# Patient Record
Sex: Female | Born: 1960 | Race: White | Hispanic: No | Marital: Married | Smoking: Never smoker
Health system: Southern US, Community
[De-identification: ages and names within clinical notes are randomized; demographics above are authoritative.]

## PROBLEM LIST (undated history)

## (undated) DIAGNOSIS — E039 Hypothyroidism, unspecified: Secondary | ICD-10-CM

## (undated) DIAGNOSIS — M069 Rheumatoid arthritis, unspecified: Secondary | ICD-10-CM

## (undated) HISTORY — PX: APPENDECTOMY: SHX54

## (undated) HISTORY — DX: Rheumatoid arthritis, unspecified: M06.9

## (undated) HISTORY — PX: TOTAL ABDOMINAL HYSTERECTOMY: SHX209

## (undated) HISTORY — DX: Hypothyroidism, unspecified: E03.9

---

## 2017-10-17 DIAGNOSIS — M7660 Achilles tendinitis, unspecified leg: Secondary | ICD-10-CM | POA: Insufficient documentation

## 2017-10-17 DIAGNOSIS — M722 Plantar fascial fibromatosis: Secondary | ICD-10-CM | POA: Insufficient documentation

## 2020-10-07 LAB — HM MAMMOGRAPHY: HM Mammogram: NORMAL (ref 0–4)

## 2020-10-07 LAB — HM COLONOSCOPY

## 2021-03-16 ENCOUNTER — Ambulatory Visit: Payer: 59 | Admitting: Rheumatology

## 2021-03-24 NOTE — Progress Notes (Signed)
Office Visit Note  Patient: Kristina Chase             Date of Birth: 1961-06-01           MRN: [id number]             PCP: Pcp, No Referring: No ref. provider found Visit Date: 04/07/2021 Occupation: _0 @  Subjective:  Medication management.   History of Present Illness: Kristina Chase is a 60 y.o. female seen in consultation per request of [place] [facility].  According the patient her symptoms a started with the bilateral knee joint pain about 10 years ago.  She states she was diagnosed with osteoarthritis by an orthopedic surgeon and went for physical therapy.  She also recalls having cortisone injection without much relief.  She continues to have pain and discomfort in her knee joints and has difficulty climbing stairs.  She states long distance walking is also limited due to knee joint discomfort.  In 2016 she started having problems with Planter fasciitis and Achilles tendinitis.  She states she had about 2 episodes of plantar fasciitis and one episode of Achilles tendinitis.  She was having a lot of difficulty walking.  She was also diagnosed with osteoarthritis in her feet by a podiatrist.  In the meantime she moved to [place] where she was evaluated by a rheumatologist who diagnosed her with seronegative rheumatoid arthritis.  She was initially tried on sulfasalazine and then Humira was added.  She had very good response to Humira and sulfasalazine was discontinued.  She states she has done very well on Humira without any joint swelling.  She continues to have some discomfort in her knee joints and her feet.  She denies any history of lower back pain or SI joint discomfort.  There is no history of iritis or uveitis.  There is no history of psoriasis.  There is no family history of psoriasis.  There is history of Crohn's disease in her maternal aunt.  She is gravida 0, there is no history of DVTs.  Activities of Daily Living:  Patient reports morning stiffness for 72mnute.   Patient  Denies nocturnal pain.  Difficulty dressing/grooming: Denies Difficulty climbing stairs: Reports Difficulty getting out of chair: Denies Difficulty using hands for taps, buttons, cutlery, and/or writing: Denies  Review of Systems  Constitutional:  Negative for fatigue, night sweats, weight gain and weight loss.  HENT:  Positive for mouth dryness. Negative for mouth sores, trouble swallowing, trouble swallowing and nose dryness.        Related to zyrtec and Lasik surgery.  Eyes:  Positive for dryness. Negative for pain, redness and visual disturbance.  Respiratory:  Negative for cough, shortness of breath and difficulty breathing.   Cardiovascular:  Negative for chest pain, palpitations, hypertension, irregular heartbeat and swelling in legs/feet.  Gastrointestinal:  Negative for blood in stool, constipation and diarrhea.  Endocrine: Negative for increased urination.  Genitourinary:  Negative for vaginal dryness.  Musculoskeletal:  Positive for joint pain and joint pain. Negative for joint swelling, myalgias, muscle weakness, morning stiffness, muscle tenderness and myalgias.  Skin:  Negative for color change, rash, hair loss, skin tightness, ulcers and sensitivity to sunlight.  Allergic/Immunologic: Negative for susceptible to infections.  Neurological:  Negative for dizziness, memory loss, night sweats and weakness.  Hematological:  Negative for swollen glands.  Psychiatric/Behavioral:  Positive for sleep disturbance. Negative for depressed mood. The patient is not nervous/anxious.    PMFS History:  Patient Active Problem List   Diagnosis Date  Noted   Autoimmune thyroiditis 04/07/2021   Primary osteoarthritis of both knees 04/07/2021    Past Medical History:  Diagnosis Date   Hypothyroid    Rheumatoid arthritis (HCC)     Family History  Problem Relation Age of Onset   Cancer Mother        Breast   Atrial fibrillation Mother    Hypertension Mother    Hypothyroidism Mother     Past Surgical History:  Procedure Laterality Date   APPENDECTOMY     TOTAL ABDOMINAL HYSTERECTOMY     Social History   Social History Narrative   Not on file   Immunization History  Administered Date(s) Administered   PFIZER(Purple Top)SARS-COV-2 Vaccination 11/09/2019, 11/30/2019, 06/15/2020, 12/07/2020     Objective: Vital Signs: BP 130/80 (BP Location: Right Arm, Patient Position: Sitting, Cuff Size: Small)   Pulse 70   Resp 12   Ht 5' 7.5" (1.715 m)   Wt 202 lb 12.8 oz (92 kg)   BMI 31.29 kg/m    Physical Exam Vitals and nursing note reviewed.  Constitutional:      Appearance: She is well-developed.  HENT:     Head: Normocephalic and atraumatic.  Eyes:     Conjunctiva/sclera: Conjunctivae normal.  Cardiovascular:     Rate and Rhythm: Normal rate and regular rhythm.     Heart sounds: Normal heart sounds.  Pulmonary:     Effort: Pulmonary effort is normal.     Breath sounds: Normal breath sounds.  Abdominal:     General: Bowel sounds are normal.     Palpations: Abdomen is soft.  Musculoskeletal:     Cervical back: Normal range of motion.  Lymphadenopathy:     Cervical: No cervical adenopathy.  Skin:    General: Skin is warm and dry.     Capillary Refill: Capillary refill takes less than 2 seconds.  Neurological:     Mental Status: She is alert and oriented to person, place, and time.  Psychiatric:        Behavior: Behavior normal.     Musculoskeletal Exam: C-spine thoracic and lumbar spine were in good range of motion.  She had no SI joint tenderness.  Shoulder joints, elbow joints, wrist joints, MCPs PIPs and DIPs with good range of motion with no synovitis.  Hip joints, knee joints, ankles, MTPs and PIPs with good range of motion with no synovitis.  She had bilateral pes cavus and hammertoes.  CDAI Exam: CDAI Score: 0.2  Patient Global: 1 mm; Provider Global: 1 mm Swollen: 0 ; Tender: 0  Joint Exam 04/07/2021   No joint exam has been documented  for this visit   There is currently no information documented on the homunculus. Go to the Rheumatology activity and complete the homunculus joint exam.  Investigation: No additional findings.  Imaging: XR Foot 2 Views Left  Result Date: 04/07/2021 PIP and DIP narrowing was noted.  No MTP, intertarsal, tibiotalar or subtalar joint space narrowing was noted.  Inferior calcaneal spur was noted.  No erosive changes were noted. Impression: These findings are consistent with osteoarthritis of the foot.  XR Foot 2 Views Right  Result Date: 04/07/2021 No MTP narrowing was noted.  PIP and DIP narrowing was noted.  Callus formation was noted in the fourth metatarsal shaft.  No intertarsal, tibiotalar or subtalar joint space narrowing was noted.  No erosive changes were noted.  Inferior calcaneal spur was noted. Impression: These findings are consistent with osteoarthritis of the foot.  XR   Hand 2 View Left  Result Date: 04/07/2021 Mild CMC, PIP and DIP narrowing was noted.  No MCP, intercarpal or radiocarpal joint space narrowing was noted.  No erosive changes were noted. Impression: These findings are consistent with osteoarthritis of the hand.  XR Hand 2 View Right  Result Date: 04/07/2021 Mild CMC, PIP and DIP narrowing was noted.  No MCP, intercarpal or radiocarpal joint space narrowing was noted.  No erosive changes were noted. Impression: These findings are consistent with osteoarthritis of the hand.  XR KNEE 3 VIEW LEFT  Result Date: 04/07/2021 Mild medial compartment narrowing and mild patellofemoral narrowing was noted.  No chondrocalcinosis was noted. Impression: These findings are consistent with mild osteoarthritis and mild chondromalacia patella.  XR KNEE 3 VIEW RIGHT  Result Date: 04/07/2021 Mild medial compartment narrowing and mild patellofemoral narrowing was noted.  No chondrocalcinosis was noted. Impression: These findings are consistent with mild osteoarthritis and mild  chondromalacia patella.   Recent Labs: No results found for: WBC, HGB, PLT, NA, K, CL, CO2, GLUCOSE, BUN, CREATININE, BILITOT, ALKPHOS, AST, ALT, PROT, ALBUMIN, CALCIUM, GFRAA, QFTBGOLD, QFTBGOLDPLUS  Speciality Comments: No specialty comments available.  Procedures:  No procedures performed Allergies: Grass extracts [gramineae pollens]   March 22 2015 HLA-B27 negative  Assessment / Plan:     Visit Diagnoses: Seronegative rheumatoid arthritis (HCC) - Dx at [facility]-2019.  F/u at [facility].  Patient had no synovitis on examination.  She was diagnosed with seronegative rheumatoid arthritis in the past.  She also had symptoms of plantar fasciitis and Achilles tendinitis.  A spondyloarthropathy was also in the differential at the time.  She was initially treated with sulfasalazine and then was switched to Humira.  She has done well on Humira.  She had no synovitis on examination today.  Medication counseling:   TB Test: Pending Hepatitis panel: Pending HIV: Pending SPEP: Pending Immunoglobulins: Pending  Chest x-ray: Pending  Does patient have diagnosis of heart failure?  No  Counseled patient that Humira is a TNF blocking agent.  Reviewed Humira dose of 40 mg every other week.  Counseled patient on purpose, proper use, and adverse effects of Humira.  Reviewed the most common adverse effects including infections, headache, and injection site reactions. Discussed that there is the possibility of an increased risk of malignancy but it is not well understood if this increased risk is due to the medication or the disease state.  Advised patient to get yearly dermatology exams due to risk of skin cancer.  Reviewed the importance of regular labs while on Humira therapy.  Advised patient to get standing labs one month after starting Humira then every 3 months.  Provided patient with standing lab orders.  Counseled patient that Humira should be held prior to scheduled surgery.  Counseled  patient to avoid live vaccines while on Humira.  Advised patient to get annual influenza vaccine and the pneumococcal vaccine as needed.  Provided patient with medication education material and answered all questions.  Patient voiced understanding.  Patient consented to Humira.  Will upload consent into the media tab.  Reviewed storage instructions of Humira.  She was advised to stop Humira in case she develops an infection and resume once the infection resolves.     High risk medication use - Initially started on SSZ then combo with Humira.  Now-Humira as monotherapy due to clinical improvement adalimumab level WNL 2021.  -I will obtain baseline labs today.  Her baseline chest x-ray will be obtained.  Plan:   CBC with Differential/Platelet, COMPLETE METABOLIC PANEL WITH GFR, Hepatitis B core antibody, IgM, Hepatitis B surface antigen, Hepatitis C antibody, QuantiFERON-TB Gold Plus, Serum protein electrophoresis with reflex, IgG, IgA, IgM, HIV Antibody (routine testing w rflx), DG Chest 2 View  Pain in both hands -she complains of discomfort in her bilateral hands.  No synovitis was noted.  Plan: Sedimentation rate, Rheumatoid factor, Cyclic citrul peptide antibody, IgG, ANA, XR Hand 2 View Right, XR Hand 2 View Left.  X-rays of bilateral hands were consistent with osteoarthritis.  Chronic pain of both knees -she complains of ongoing pain and discomfort in her bilateral knee joints.  No warmth swelling or effusion was noted.  Plan: XR KNEE 3 VIEW RIGHT, XR KNEE 3 VIEW LEFT.  X-ray showed mild osteoarthritis and mild chondromalacia patella.  Primary osteoarthritis of both knees-she was diagnosed with osteoarthritis in the past.  Pain in both feet -she complains of discomfort in the bilateral feet.  Plan: XR Foot 2 Views Right, XR Foot 2 Views Left, x-rays show are consistent with osteoarthritis.  Callus formation was noted in the right fourth metatarsal shaft.  Achilles tendinitis of both lower extremities  - HLA-B27 negative  Plantar fasciitis, bilateral-patient had 2 episodes of plantar fasciitis and one episode of Achilles tendinitis in the past.  She states her symptoms resolved after starting Humira.  Other fatigue - Plan: CK, TSH  Autoimmune thyroiditis  Family history of Crohn's disease - maternal aunt  Orders: Orders Placed This Encounter  Procedures   XR Hand 2 View Right   XR Hand 2 View Left   XR Foot 2 Views Right   XR Foot 2 Views Left   XR KNEE 3 VIEW RIGHT   XR KNEE 3 VIEW LEFT   DG Chest 2 View   CBC with Differential/Platelet   COMPLETE METABOLIC PANEL WITH GFR   Sedimentation rate   CK   TSH   Rheumatoid factor   Cyclic citrul peptide antibody, IgG   ANA   Hepatitis B core antibody, IgM   Hepatitis B surface antigen   Hepatitis C antibody   QuantiFERON-TB Gold Plus   Serum protein electrophoresis with reflex   IgG, IgA, IgM   HIV Antibody (routine testing w rflx)    No orders of the defined types were placed in this encounter.    Follow-Up Instructions: Return for Rheumatoid arthritis.   Bo Merino, MD  Note - This record has been created using Editor, commissioning.  Chart creation errors have been sought, but may not always  have been located. Such creation errors do not reflect on  the standard of medical care.

## 2021-04-07 ENCOUNTER — Ambulatory Visit: Payer: Self-pay

## 2021-04-07 ENCOUNTER — Ambulatory Visit (INDEPENDENT_AMBULATORY_CARE_PROVIDER_SITE_OTHER): Payer: 59 | Admitting: Rheumatology

## 2021-04-07 ENCOUNTER — Other Ambulatory Visit: Payer: Self-pay

## 2021-04-07 ENCOUNTER — Encounter: Payer: Self-pay | Admitting: Rheumatology

## 2021-04-07 VITALS — BP 130/80 | HR 70 | Resp 12 | Ht 67.5 in | Wt 202.8 lb

## 2021-04-07 DIAGNOSIS — M79671 Pain in right foot: Secondary | ICD-10-CM | POA: Diagnosis not present

## 2021-04-07 DIAGNOSIS — M7661 Achilles tendinitis, right leg: Secondary | ICD-10-CM

## 2021-04-07 DIAGNOSIS — M19071 Primary osteoarthritis, right ankle and foot: Secondary | ICD-10-CM

## 2021-04-07 DIAGNOSIS — E063 Autoimmune thyroiditis: Secondary | ICD-10-CM

## 2021-04-07 DIAGNOSIS — M79642 Pain in left hand: Secondary | ICD-10-CM

## 2021-04-07 DIAGNOSIS — M170 Bilateral primary osteoarthritis of knee: Secondary | ICD-10-CM

## 2021-04-07 DIAGNOSIS — G8929 Other chronic pain: Secondary | ICD-10-CM

## 2021-04-07 DIAGNOSIS — M0600 Rheumatoid arthritis without rheumatoid factor, unspecified site: Secondary | ICD-10-CM

## 2021-04-07 DIAGNOSIS — M19072 Primary osteoarthritis, left ankle and foot: Secondary | ICD-10-CM

## 2021-04-07 DIAGNOSIS — Z79899 Other long term (current) drug therapy: Secondary | ICD-10-CM | POA: Diagnosis not present

## 2021-04-07 DIAGNOSIS — M722 Plantar fascial fibromatosis: Secondary | ICD-10-CM

## 2021-04-07 DIAGNOSIS — M79672 Pain in left foot: Secondary | ICD-10-CM | POA: Diagnosis not present

## 2021-04-07 DIAGNOSIS — M7662 Achilles tendinitis, left leg: Secondary | ICD-10-CM

## 2021-04-07 DIAGNOSIS — M25561 Pain in right knee: Secondary | ICD-10-CM

## 2021-04-07 DIAGNOSIS — Z8379 Family history of other diseases of the digestive system: Secondary | ICD-10-CM

## 2021-04-07 DIAGNOSIS — M25562 Pain in left knee: Secondary | ICD-10-CM

## 2021-04-07 DIAGNOSIS — R5383 Other fatigue: Secondary | ICD-10-CM

## 2021-04-07 DIAGNOSIS — M79641 Pain in right hand: Secondary | ICD-10-CM | POA: Diagnosis not present

## 2021-04-07 NOTE — Patient Instructions (Signed)
Adalimumab Injection What is this medication? ADALIMUMAB (ay da LIM yoo mab) is used to treat rheumatoid and psoriatic arthritis. It is also used to treat ankylosing spondylitis, Crohn's disease, ulcerative colitis, plaque psoriasis, hidradenitis suppurativa, and uveitis. This medicine may be used for other purposes; ask your health care provider or pharmacist if you have questions. COMMON BRAND NAME(S): CYLTEZO, Humira What should I tell my care team before I take this medication? They need to know if you have any of these conditions: cancer diabetes (high blood sugar) having surgery heart disease hepatitis B immune system problems infections, such as tuberculosis (TB) or other bacterial, fungal, or viral infections multiple sclerosis recent or upcoming vaccine an unusual reaction to adalimumab, mannitol, latex, rubber, other medicines, foods, dyes, or preservatives pregnant or trying to get pregnant breast-feeding How should I use this medication? This medicine is for injection under the skin. You will be taught how to prepare and give it. Take it as directed on the prescription label. Keep taking it unless your health care provider tells you to stop. It is important that you put your used needles and syringes in a special sharps container. Do not put them in a trash can. If you do not have a sharps container, call your pharmacist or health care provider to get one. This medicine comes with INSTRUCTIONS FOR USE. Ask your pharmacist for directions on how to use this medicine. Read the information carefully. Talk to your pharmacist or health care provider if you have questions. A special MedGuide will be given to you by the pharmacist with each prescription and refill. Be sure to read this information carefully each time. Talk to your pediatrician regarding the use of this medicine in children. While this drug may be prescribed for children as young as 2 years for selected conditions,  precautions do apply. Overdosage: If you think you have taken too much of this medicine contact a poison control center or emergency room at once. NOTE: This medicine is only for you. Do not share this medicine with others. What if I miss a dose? If you miss a dose, take it as soon as you can. If it is almost time for your next dose, take only that dose. Do not take double or extra doses. It is important not to miss any doses. Talk to your health care provider about what to do if you miss a dose. What may interact with this medication? Do not take this medicine with any of the following medications: abatacept anakinra biologic medicines such as certolizumab, etanercept, golimumab, infliximab live virus vaccines This medicine may also interact with the following medications: cyclosporine theophylline vaccines warfarin This list may not describe all possible interactions. Give your health care provider a list of all the medicines, herbs, non-prescription drugs, or dietary supplements you use. Also tell them if you smoke, drink alcohol, or use illegal drugs. Some items may interact with your medicine. What should I watch for while using this medication? Visit your health care provider for regular checks on your progress. Tell your health care provider if your symptoms do not start to get better or if they get worse. You will be tested for tuberculosis (TB) before you start this medicine. If your doctor prescribes any medicine for TB, you should start taking the TB medicine before starting this medicine. Make sure to finish the full course of TB medicine. This medicine may increase your risk of getting an infection. Call your health care provider for advice if you  get a fever, chills, sore throat, or other symptoms of a cold or flu. Do not treat yourself. Try to avoid being around people who are sick. Talk to your health care provider about your risk of cancer. You may be more at risk for certain  types of cancer if you take this medicine. What side effects may I notice from receiving this medication? Side effects that you should report to your doctor or health care professional as soon as possible: allergic reactions like skin rash, itching or hives, swelling of the face, lips, or tongue changes in vision chest pain dizziness heart failure (trouble breathing; fast, irregular heartbeat; sudden weight gain; swelling of the ankles, feet, hands; unusually weak or tired) infection (fever, chills, cough, sore throat, pain or trouble passing urine) liver injury (dark yellow or brown urine; general ill feeling or flu-like symptoms; loss of appetite, right upper belly pain; unusually weak or tired, yellowing of the eyes or skin) lump or swollen lymph nodes on the neck, groin, or underarm area muscle weakness pain, tingling, numbness in the hands or feet red, scaly patches or raised bumps on the skin trouble breathing unusual bleeding or bruising unusually weak or tired Side effects that usually do not require medical attention (report to your doctor or health care professional if they continue or are bothersome): headache nausea pain, redness, or irritation at site where injected stuffy or runny nose This list may not describe all possible side effects. Call your doctor for medical advice about side effects. You may report side effects to FDA at 1-800-FDA-1088. Where should I keep my medication? Keep out of the reach of children and pets. Store in the refrigerator between 2 and 8 degrees C (36 and 46 degrees F). Do not freeze. Keep this medicine in the original packaging until you are ready to take it. Protect from light. Get rid of any unused medicine after the expiration date. This medicine may be stored at room temperature for up to 14 days. Keep this medicine in the original packaging. Protect from light. If it is stored at room temperature, get rid of any unused medicine after 14 days  or after it expires, whichever is first. To get rid of medicines that are no longer needed or have expired: Take the medicine to a medicine take-back program. Check with your pharmacy or law enforcement to find a location. If you cannot return the medicine, ask your pharmacist or health care provider how to get rid of this medicine safely. NOTE: This sheet is a summary. It may not cover all possible information. If you have questions about this medicine, talk to your doctor, pharmacist, or health care provider.  2022 Elsevier/Gold Standard (2019-09-03 1 If you test POSITIVE for COVID19 and have MILD to MODERATE symptoms: First, call your PCP if you would like to receive COVID19 treatment AND Hold your medications during the infection and for at least 1 week after your symptoms have resolved: Injectable medication (Benlysta, Cimzia, Cosentyx, Enbrel, Humira, Orencia, Remicade, Simponi, Stelara, Taltz, Tremfya) Methotrexate Leflunomide (Arava) Azathioprine Mycophenolate (Cellcept) Osborne O[place], or Rinvoq Otezla If you take Actemra or Kevzara, you DO NOT need to hold these for COVID19 infection.  If you test POSITIVE for COVID19 and have NO symptoms: First, call your PCP if you would like to receive COVID19 treatment AND Hold your medications for at least 10 days after the day that you tested positive Injectable medication (Benlysta, Cimzia, Cosentyx, Enbrel, Humira, Orencia, Remicade, Simponi, Stelara, Taltz, Tremfya) Methotrexate Leflunomide (  Arava) Azathioprine Mycophenolate (Cellcept) Osborne [place], or Rinvoq [place] If you take Actemra or Carlis Abbott, you DO NOT need to hold these for COVID19 infection.  If you have signs or symptoms of an infection or start antibiotics: First, call your PCP for workup of your infection. Hold your medication through the infection, until you complete your antibiotics, and until symptoms resolve if you take the following: Injectable medication  (Actemra, Benlysta, Cimzia, Cosentyx, Enbrel, Humira, Kevzara, Orencia, Remicade, Simponi, Stelara, Taltz, Tremfya) Methotrexate Leflunomide (Arava) Mycophenolate (Cellcept) Harriette Ohara, Olumiant, or Rinvoq  Vaccines You are taking a medication(s) that can suppress your immune system.  The following immunizations are recommended: Flu annually Covid-19  Td/Tdap (tetanus, diphtheria, pertussis) every 10 years Pneumonia (Prevnar 15 then Pneumovax 23 at least 1 year apart.  Alternatively, can take Prevnar 20 without needing additional dose) Shingrix: 2 doses from 4 weeks to 6 months apart  Please check with your PCP to make sure you are up to date.  Please a schedule an appointment with your dermatologist to screen for nonmelanoma skin cancer while you are Humira.   Standing Labs We placed an order today for your standing lab work.   Please have your standing labs drawn in December and every 3 months  If possible, please have your labs drawn 2 weeks prior to your appointment so that the provider can discuss your results at your appointment.  Please note that you may see your imaging and lab results in MyChart before we have reviewed them. We may be awaiting multiple results to interpret others before contacting you. Please allow our office up to 72 hours to thoroughly review all of the results before contacting the office for clarification of your results.  We have open lab daily: Monday through Thursday from 1:30-4:30 PM and Friday from 1:30-4:00 PM at the office of Dr. Pollyann Savoy, [facility].   Please be advised, all patients with office appointments requiring lab work will take precedent over walk-in lab work.  If possible, please come for your lab work on Monday and Friday afternoons, as you may experience shorter wait times. The office is located at [address], [place], [place] [address] No appointment is necessary.   Labs are drawn by [facility]. Please  bring your co-pay at the time of your lab draw.  You may receive a bill from [facility] for your lab work.  If you wish to have your labs drawn at another location, please call the office 24 hours in advance to send orders.  If you have any questions regarding directions or hours of operation,  please call [phone].   As a reminder, please drink plenty of water prior to coming for your lab work. Thanks!

## 2021-04-10 ENCOUNTER — Ambulatory Visit: Payer: 59 | Admitting: Rheumatology

## 2021-04-10 ENCOUNTER — Encounter: Payer: Self-pay | Admitting: Rheumatology

## 2021-04-10 NOTE — Progress Notes (Signed)
Please add anti-tTG and antigliadin antibody if possible

## 2021-04-11 ENCOUNTER — Telehealth: Payer: Self-pay | Admitting: Pharmacist

## 2021-04-11 DIAGNOSIS — Z79899 Other long term (current) drug therapy: Secondary | ICD-10-CM

## 2021-04-11 DIAGNOSIS — M0600 Rheumatoid arthritis without rheumatoid factor, unspecified site: Secondary | ICD-10-CM

## 2021-04-11 NOTE — Telephone Encounter (Signed)
Submitted a Prior Authorization request to [facility] for HUMIRA via CoverMyMeds. Will update once we receive a response.   Key: [id number] - PA Case ID: [id number]  Humira copay card:  Card: [id number]  Rx GROUP: [id number]  Rx BIN: [id number]  Rx PCN: [id number]  Suf: 01

## 2021-04-11 NOTE — Telephone Encounter (Signed)
Please start Humira BIV. This is continuation of therapy (patient transferring care from [place])  Dose: 40mg  every 14 days  Dx: Rheumatoid arthritis (M05.9)  Previously tried therapies: Sulfasalazine  Patient eligible for copay card  , PharmD, MPH, BCPS Clinical Pharmacist (Rheumatology and Pulmonology)

## 2021-04-12 LAB — GLIADIN ANTIBODIES, SERUM
Gliadin IgA: 1.7 U/mL
Gliadin IgG: <1 U/mL

## 2021-04-12 LAB — TISSUE TRANSGLUTAMINASE ABS,IGG,IGA
(tTG) Ab, IgA: <1 U/mL
(tTG) Ab, IgG: 1 U/mL

## 2021-04-12 LAB — CBC WITH DIFFERENTIAL/PLATELET
Absolute Monocytes: 725 cells/uL (ref 200–950)
Basophils Absolute: 69 cells/uL (ref 0–200)
Basophils Relative: 1 %
Eosinophils Absolute: 228 cells/uL (ref 15–500)
Eosinophils Relative: 3.3 %
HCT: 43.1 % (ref 35.0–45.0)
Hemoglobin: 14.2 g/dL (ref 11.7–15.5)
Lymphs Abs: 2774 cells/uL (ref 850–3900)
MCH: 30 pg (ref 27.0–33.0)
MCHC: 32.9 g/dL (ref 32.0–36.0)
MCV: 90.9 fL (ref 80.0–100.0)
MPV: 11.1 fL (ref 7.5–12.5)
Monocytes Relative: 10.5 %
Neutro Abs: 3105 cells/uL (ref 1500–7800)
Neutrophils Relative %: 45 %
Platelets: 265 10*3/uL (ref 140–400)
RBC: 4.74 10*6/uL (ref 3.80–5.10)
RDW: 12.4 % (ref 11.0–15.0)
Total Lymphocyte: 40.2 %
WBC: 6.9 10*3/uL (ref 3.8–10.8)

## 2021-04-12 LAB — QUANTIFERON-TB GOLD PLUS
Mitogen-NIL: >10 IU/mL
NIL: 0.08 IU/mL
QuantiFERON-TB Gold Plus: NEGATIVE
TB1-NIL: 0 IU/mL
TB2-NIL: 0 IU/mL

## 2021-04-12 LAB — PROTEIN ELECTROPHORESIS, SERUM, WITH REFLEX
Albumin ELP: 4.1 g/dL (ref 3.8–4.8)
Alpha 1: 0.2 g/dL (ref 0.2–0.3)
Alpha 2: 0.7 g/dL (ref 0.5–0.9)
Beta 2: 0.5 g/dL (ref 0.2–0.5)
Beta Globulin: 0.5 g/dL (ref 0.4–0.6)
Gamma Globulin: 1.1 g/dL (ref 0.8–1.7)
Total Protein: 7 g/dL (ref 6.1–8.1)

## 2021-04-12 LAB — COMPLETE METABOLIC PANEL WITH GFR
AG Ratio: 1.4 (calc) (ref 1.0–2.5)
ALT: 19 U/L (ref 6–29)
AST: 20 U/L (ref 10–35)
Albumin: 4 g/dL (ref 3.6–5.1)
Alkaline phosphatase (APISO): 65 U/L (ref 37–153)
BUN: 19 mg/dL (ref 7–25)
CO2: 26 mmol/L (ref 20–32)
Calcium: 9.6 mg/dL (ref 8.6–10.4)
Chloride: 99 mmol/L (ref 98–110)
Creat: 0.79 mg/dL (ref 0.50–1.03)
Globulin: 2.8 g/dL (calc) (ref 1.9–3.7)
Glucose, Bld: 83 mg/dL (ref 65–99)
Potassium: 4.2 mmol/L (ref 3.5–5.3)
Sodium: 138 mmol/L (ref 135–146)
Total Bilirubin: 0.4 mg/dL (ref 0.2–1.2)
Total Protein: 6.8 g/dL (ref 6.1–8.1)
eGFR: 87 mL/min/{1.73_m2} (ref >=60)

## 2021-04-12 LAB — HEPATITIS B CORE ANTIBODY, IGM: Hep B C IgM: NONREACTIVE

## 2021-04-12 LAB — TEST AUTHORIZATION

## 2021-04-12 LAB — HEPATITIS C ANTIBODY
Hepatitis C Ab: NONREACTIVE
SIGNAL TO CUT-OFF: 0.01 (ref <1.00)

## 2021-04-12 LAB — SEDIMENTATION RATE: Sed Rate: 2 mm/h (ref 0–30)

## 2021-04-12 LAB — HEPATITIS B SURFACE ANTIGEN: Hepatitis B Surface Ag: NONREACTIVE

## 2021-04-12 LAB — HIV ANTIBODY (ROUTINE TESTING W REFLEX): HIV 1&2 Ab, 4th Generation: NONREACTIVE

## 2021-04-12 LAB — CYCLIC CITRUL PEPTIDE ANTIBODY, IGG: Cyclic Citrullin Peptide Ab: <16 UNITS

## 2021-04-12 LAB — CK: Total CK: 49 U/L (ref 29–143)

## 2021-04-12 LAB — ANA: Anti Nuclear Antibody (ANA): NEGATIVE

## 2021-04-12 LAB — TSH: TSH: 1.27 mIU/L (ref 0.40–4.50)

## 2021-04-12 LAB — IGG, IGA, IGM
IgG (Immunoglobin G), Serum: 1149 mg/dL (ref 600–1640)
IgM, Serum: 112 mg/dL (ref 50–300)
Immunoglobulin A: 425 mg/dL — ABNORMAL HIGH (ref 47–310)

## 2021-04-12 LAB — RHEUMATOID FACTOR: Rheumatoid fact SerPl-aCnc: <14 IU/mL (ref <14)

## 2021-04-12 NOTE — Telephone Encounter (Signed)
Received notification from [facility] regarding a prior authorization for HUMIRA. Authorization has been previously APPROVED from 09/07/20 to 09/07/21.   Patient must continue to fill through [facility]: [phone]   Authorization # [id number] Phone # [phone]  Awaiting chest x-ray results prior to sending refills to pharmacy  Chesley Mires, PharmD, MPH, BCPS Clinical Pharmacist (Rheumatology and Pulmonology)

## 2021-04-12 NOTE — Progress Notes (Signed)
All the labs are within normal limits.  I will discuss results at the follow-up visit.

## 2021-04-17 NOTE — Progress Notes (Signed)
Office Visit Note  Patient: Kristina Chase             Date of Birth: April 11, 1961           MRN: [id number]             PCP: Jeanie Sewer, NP Referring: No ref. provider found Visit Date: 05/01/2021 Occupation: _0 @  Subjective:  Joint pain.   History of Present Illness: Kristina Chase is a 60 y.o. female with a history of seronegative rheumatoid arthritis.  She continues to have some pain and stiffness in her hands and feet.  She has not seen any visible swelling.  She has not had an episode of Achilles tendinitis or plantar fasciitis in the last 1 year.  Activities of Daily Living:  Patient reports morning stiffness for 0 minutes.   Patient Reports nocturnal pain.  Difficulty dressing/grooming: Denies Difficulty climbing stairs: Reports Difficulty getting out of chair: Denies Difficulty using hands for taps, buttons, cutlery, and/or writing: Reports  Review of Systems  Constitutional:  Negative for fatigue.  HENT:  Negative for mouth sores, mouth dryness and nose dryness.   Eyes:  Positive for dryness. Negative for pain and itching.  Respiratory:  Negative for shortness of breath and difficulty breathing.   Cardiovascular:  Negative for chest pain and palpitations.  Gastrointestinal:  Negative for blood in stool, constipation and diarrhea.  Endocrine: Negative for increased urination.  Genitourinary:  Negative for difficulty urinating.  Musculoskeletal:  Positive for joint pain, joint pain, myalgias and myalgias. Negative for joint swelling, morning stiffness and muscle tenderness.  Skin:  Negative for color change, rash and redness.  Allergic/Immunologic: Negative for susceptible to infections.  Neurological:  Negative for dizziness, numbness, headaches, memory loss and weakness.  Hematological:  Positive for bruising/bleeding tendency.  Psychiatric/Behavioral:  Positive for sleep disturbance. Negative for confusion.    PMFS History:  Patient Active Problem List    Diagnosis Date Noted   Primary osteoarthritis of both hands 05/01/2021   Primary osteoarthritis of both feet 05/01/2021   LBBB (left bundle branch block) 05/01/2021   Hilar lymphadenopathy 05/01/2021   Hormone replacement therapy (postmenopausal) 04/21/2021   Adjustment insomnia 04/21/2021   Seronegative rheumatoid arthritis ([place]) 04/21/2021   Need for vaccination 04/21/2021   Autoimmune thyroiditis 04/07/2021   Primary osteoarthritis of both knees 04/07/2021    Past Medical History:  Diagnosis Date   Hypothyroid    Rheumatoid arthritis ([place])     Family History  Problem Relation Age of Onset   Cancer Mother        Breast   Atrial fibrillation Mother    Hypertension Mother    Hypothyroidism Mother    Past Surgical History:  Procedure Laterality Date   APPENDECTOMY     TOTAL ABDOMINAL HYSTERECTOMY     Social History   Social History Narrative   Not on file   Immunization History  Administered Date(s) Administered   Influenza,inj,Quad PF,6+ Mos 04/21/2021   PFIZER(Purple Top)SARS-COV-2 Vaccination 11/09/2019, 11/30/2019, 06/15/2020, 12/07/2020, 04/24/2021   Zoster Recombinat (Shingrix) 07/09/2017     Objective: Vital Signs: BP 120/82 (BP Location: Left Arm, Patient Position: Sitting, Cuff Size: Normal)   Pulse 73   Ht _1  (1.727 m)   Wt 202 lb 9.6 oz (91.9 kg)   BMI 30.81 kg/m    Physical Exam Vitals and nursing note reviewed.  Constitutional:      Appearance: She is well-developed.  HENT:     Head: Normocephalic and atraumatic.  Eyes:  Conjunctiva/sclera: Conjunctivae normal.  Cardiovascular:     Rate and Rhythm: Normal rate and regular rhythm.     Heart sounds: Normal heart sounds.  Pulmonary:     Effort: Pulmonary effort is normal.     Breath sounds: Normal breath sounds.  Abdominal:     General: Bowel sounds are normal.     Palpations: Abdomen is soft.  Musculoskeletal:     Cervical back: Normal range of motion.  Lymphadenopathy:      Cervical: No cervical adenopathy.  Skin:    General: Skin is warm and dry.     Capillary Refill: Capillary refill takes less than 2 seconds.  Neurological:     Mental Status: She is alert and oriented to person, place, and time.  Psychiatric:        Behavior: Behavior normal.     Musculoskeletal Exam: C-spine was in good range of motion.  Shoulder joints and elbow joints with good range of motion.  She had discomfort over SI joints.  Shoulder joints, elbow joints, wrist joints, MCPs PIPs and DIPs with good range of motion.  She had tenderness on palpation of her right second third fourth and fifth MCPs.  Hip joints and knee joints in good range of motion.  There was no tenderness over ankles or MTPs.  CDAI Exam: CDAI Score: 4.4  Patient Global: 2 mm; Provider Global: 2 mm Swollen: 0 ; Tender: 4  Joint Exam 05/01/2021      Right  Left  MCP 2   Tender     MCP 3   Tender     MCP 4   Tender     MCP 5   Tender        Investigation: No additional findings.  Imaging: DG Chest 2 View  Result Date: 04/20/2021 CLINICAL DATA:  Immunosuppressive therapy EXAM: CHEST - 2 VIEW COMPARISON:  None. FINDINGS: The heart size and mediastinal contours are within normal limits. Multiple calcified mediastinal and left hilar lymph nodes suggesting sequela of chronic granulomatous disease. Both lungs are clear. The visualized skeletal structures are unremarkable. IMPRESSION: 1. No active cardiopulmonary disease. 2. Calcified mediastinal and left hilar lymph nodes suggesting sequela of granulomatous disease. Electronically Signed   By: Davina Poke D.O.   On: 04/20/2021 15:50   XR Foot 2 Views Left  Result Date: 04/07/2021 PIP and DIP narrowing was noted.  No MTP, intertarsal, tibiotalar or subtalar joint space narrowing was noted.  Inferior calcaneal spur was noted.  No erosive changes were noted. Impression: These findings are consistent with osteoarthritis of the foot.  XR Foot 2 Views  Right  Result Date: 04/07/2021 No MTP narrowing was noted.  PIP and DIP narrowing was noted.  Callus formation was noted in the fourth metatarsal shaft.  No intertarsal, tibiotalar or subtalar joint space narrowing was noted.  No erosive changes were noted.  Inferior calcaneal spur was noted. Impression: These findings are consistent with osteoarthritis of the foot.  XR Hand 2 View Left  Result Date: 04/07/2021 Mild CMC, PIP and DIP narrowing was noted.  No MCP, intercarpal or radiocarpal joint space narrowing was noted.  No erosive changes were noted. Impression: These findings are consistent with osteoarthritis of the hand.  XR Hand 2 View Right  Result Date: 04/07/2021 Mild CMC, PIP and DIP narrowing was noted.  No MCP, intercarpal or radiocarpal joint space narrowing was noted.  No erosive changes were noted. Impression: These findings are consistent with osteoarthritis of the hand.  XR  KNEE 3 VIEW LEFT  Result Date: 04/07/2021 Mild medial compartment narrowing and mild patellofemoral narrowing was noted.  No chondrocalcinosis was noted. Impression: These findings are consistent with mild osteoarthritis and mild chondromalacia patella.  XR KNEE 3 VIEW RIGHT  Result Date: 04/07/2021 Mild medial compartment narrowing and mild patellofemoral narrowing was noted.  No chondrocalcinosis was noted. Impression: These findings are consistent with mild osteoarthritis and mild chondromalacia patella.   Recent Labs: Lab Results  Component Value Date   WBC 6.9 04/07/2021   HGB 14.2 04/07/2021   PLT 265 04/07/2021   NA 138 04/07/2021   K 4.2 04/07/2021   CL 99 04/07/2021   CO2 26 04/07/2021   GLUCOSE 83 04/07/2021   BUN 19 04/07/2021   CREATININE 0.79 04/07/2021   BILITOT 0.4 04/07/2021   AST 20 04/07/2021   ALT 19 04/07/2021   PROT 6.8 04/07/2021   PROT 7.0 04/07/2021   CALCIUM 9.6 04/07/2021   QFTBGOLDPLUS NEGATIVE 04/07/2021   04/07/21 SPEP normal, IgA mildly elevated,TB Gold  negative, Hep B -, Hep C -, HIV-, CK 49, TSH normal, ttg-, Anti- Gliadin -, ANA-, RF-, anti-CCP -, ESR 2  March 22 2015 HLA-B27 negative   Speciality Comments: FU q 3 mths. dxd2019 [facility]. TTd with SSZ, Humira 2019  Procedures:  No procedures performed Allergies: Grass extracts [gramineae pollens]   Assessment / Plan:     Visit Diagnoses: Seronegative rheumatoid arthritis () - Dx at [facility].  F/u at [facility] rhe[facility]. d/d spondyloarthropathy.H/o plantar fasciitis, achilles tendinitis.Ttd with SSZ and later Humira.  Patient states despite being on Humira she continues to have some joint pain and stiffness in her hands and her feet.  Her sed rate was normal.  She has tenderness over right hand MCP joints.  No synovitis was noted.  I plan to obtain an ultrasound of her hands.  I also discussed the option of adding a DMARD to Humira.  We discussed the option of methotrexate.  Indications side effects contraindications were discussed.  She did not like the side effects and declined the medication.  I also discussed possible use of leflunomide.  She would like to think more about it.  She does not like the idea of increased immunosuppression.  High risk medication use - Ttd with SSZ 2019. Then Humira was added in 2019 and later SSZ was dcd.  April 07, 2021 CBC was normal and CMP was normal.  TB gold was negative.  All the labs from the last visit were reviewed.  She has been advised to get labs every 3 months which will include CBC and CMP.  Information on the immunization was given at the last visit which was reviewed.  She was advised to stop Humira in case she develops an infection and restart after the infection resolves.  Primary osteoarthritis of both hands-x-ray showed osteoarthritic changes.  She also has some DIP prominence consistent with osteoarthritis.  She had tenderness across MCPs today.  We will schedule ultrasound of bilateral hands to evaluate this  further.  Primary osteoarthritis of both feet-she continues to have discomfort in her feet off and on.  There was no tenderness on examination today.  X-rays were consistent with osteoarthritis.  Primary osteoarthritis of both knees - Bilateral mild osteoarthritis and mild chondromalacia patella.  X-ray findings were discussed with the patient.  Lower extremity muscle strengthening was discussed.  Achilles tendinitis of both lower extremities - HLA-B27 negative.  She has not had recurrence in the last  1 year.  Plantar fasciitis, bilateral-no recurrence in the last 1 year.  LBBB (left bundle branch block) - Noted in September 2020 on the EKG by her cardiologist in [place] per patient.  She will upload the information on the chart.  Hilar lymphadenopathy - Multiple calcified mediastinal and left hilar lymph nodes were noted on the chest x-ray.  I discussed the x-ray findings with the patient.  Have we will make a pulmonary referral for the evaluation.  Autoimmune thyroiditis  Family history of Crohn's disease - maternal  aunt  Orders: No orders of the defined types were placed in this encounter.  Meds ordered this encounter  Medications   Adalimumab (HUMIRA PEN) 40 MG/0.4ML PNKT    Sig: Humira(CF) Pen 40 mg/0.4 mL subcutaneous kit    Dispense:  6 each    Refill:  0    3 kits= 6 pens      Follow-Up Instructions: Return in about 3 months (around 08/01/2021) for Rheumatoid arthritis.   Bo Merino, MD  Note - This record has been created using Editor, commissioning.  Chart creation errors have been sought, but may not always  have been located. Such creation errors do not reflect on  the standard of medical care.

## 2021-04-18 ENCOUNTER — Ambulatory Visit (HOSPITAL_BASED_OUTPATIENT_CLINIC_OR_DEPARTMENT_OTHER)
Admission: RE | Admit: 2021-04-18 | Discharge: 2021-04-18 | Disposition: A | Discharge status: Home / Self Care | Payer: 59 | Source: Ambulatory Visit | Attending: Rheumatology | Admitting: Rheumatology

## 2021-04-18 ENCOUNTER — Other Ambulatory Visit: Payer: Self-pay

## 2021-04-18 DIAGNOSIS — Z79899 Other long term (current) drug therapy: Secondary | ICD-10-CM | POA: Diagnosis not present

## 2021-04-20 ENCOUNTER — Ambulatory Visit: Payer: 59 | Admitting: Physician Assistant

## 2021-04-21 ENCOUNTER — Ambulatory Visit (INDEPENDENT_AMBULATORY_CARE_PROVIDER_SITE_OTHER): Payer: 59 | Admitting: Family

## 2021-04-21 ENCOUNTER — Other Ambulatory Visit: Payer: Self-pay

## 2021-04-21 ENCOUNTER — Encounter: Payer: Self-pay | Admitting: Family

## 2021-04-21 VITALS — BP 116/68 | HR 69 | Temp 97.6°F | Ht 67.5 in | Wt 199.6 lb

## 2021-04-21 DIAGNOSIS — M0600 Rheumatoid arthritis without rheumatoid factor, unspecified site: Secondary | ICD-10-CM | POA: Diagnosis not present

## 2021-04-21 DIAGNOSIS — E063 Autoimmune thyroiditis: Secondary | ICD-10-CM

## 2021-04-21 DIAGNOSIS — Z79890 Hormone replacement therapy (postmenopausal): Secondary | ICD-10-CM | POA: Diagnosis not present

## 2021-04-21 DIAGNOSIS — F5102 Adjustment insomnia: Secondary | ICD-10-CM | POA: Insufficient documentation

## 2021-04-21 DIAGNOSIS — N951 Menopausal and female climacteric states: Secondary | ICD-10-CM | POA: Insufficient documentation

## 2021-04-21 DIAGNOSIS — Z23 Encounter for immunization: Secondary | ICD-10-CM

## 2021-04-21 NOTE — Assessment & Plan Note (Addendum)
Followed by [facility] - Humira injections

## 2021-04-21 NOTE — Assessment & Plan Note (Signed)
Pt has been taking Nyquil Z prn and Melatonin 5mg  qpm. Started a few weeks ago. Has taken Ambien in the past, needs this mainly with travel. Advised on trying magnesium glycinate or L-threonate for sleep maintenance, 400-800mg  qhs.

## 2021-04-21 NOTE — Assessment & Plan Note (Signed)
Stable on current Levothyroxine dose, TSH checked

## 2021-04-21 NOTE — Telephone Encounter (Signed)
ATC patient to assess how many Humira pens she has remaining at home. Left VM requesting return call on Monday, 04/24/21.  Patient will also need dermatology referral placed if she does not see dermatologist.  Chesley Mires, PharmD, MPH, BCPS Clinical Pharmacist (Rheumatology and Pulmonology)

## 2021-04-21 NOTE — Assessment & Plan Note (Signed)
Hx of complete hysterectomy in 2008. Mother with breast cancer, GM ovarian ca. Stable on low dose estradiol patch.

## 2021-04-21 NOTE — Patient Instructions (Addendum)
Welcome to [employer] [facility] family practice at [facility]! It was a pleasure meeting you today. As discussed, please let us know through MyChart when you are needing refills, or have your pharmacy send [place] the request. You can use MyChart to communicate with me or any staff. Please schedule a 6 month follow up appointment at your convenience, and we can see you sooner for any concerns if needed.

## 2021-04-21 NOTE — Progress Notes (Signed)
New Patient Office Visit  Subjective:  Patient ID: Lexany Belknap, female    DOB: Feb 09, 1961  Age: 60 y.o. MRN: [id number]  CC,   Chief Complaint  Patient presents with   Establish Care    HPI Hallel Denherder presents to establish care today. Rheumatoid arthritis: followed by Rheumatology, symptoms currently controlled with Humira. Hypothyroidism: controlled with current dose of Levothyroxine, last TSH 1 month ago wnl. Hormone replacement therapy: pt postmenopausal since total hysterectomy in 2008. Symptoms controlled with Estradiol patch. Insomnia: reports starting a few weeks ago. Taking Nyquil Z prn and Melatonin 35m, 1-2tabs qhs.   Past Medical History:  Diagnosis Date   Hypothyroid    [place]     Past Surgical History:  Procedure Laterality Date   APPENDECTOMY     TOTAL ABDOMINAL HYSTERECTOMY      Family History  Problem Relation Age of Onset   Cancer Mother        Breast   Atrial fibrillation Mother    Hypertension Mother    Hypothyroidism Mother     Social History   Socioeconomic History   Marital status: Married    Spouse name: Not on file   Number of children: Not on file   Years of education: Not on file   Highest education level: Not on file  Occupational History   Not on file  Tobacco Use   Smoking status: Never   Smokeless tobacco: Never  Vaping Use   Vaping Use: Never used  Substance and Sexual Activity   Alcohol use: Yes    Alcohol/week: 7.0 standard drinks    Types: 7 Glasses of wine per week   Drug use: Never   Sexual activity: Yes  Other Topics Concern   Not on file  Social History Narrative   Not on file   Social Determinants of Health   Financial Resource Strain: Not on file  Food Insecurity: Not on file  Transportation Needs: Not on file  Physical Activity: Not on file  Stress: Not on file  Social Connections: Not on file  Intimate Partner Violence: Not on file     Objective:   Today's Vitals: BP  116/68   Pulse 69   Temp 97.6 F (36.4 C)   Ht 5' 7.5" (1.715 m)   Wt 199 lb 9.6 oz (90.5 kg)   SpO2 99%   BMI 30.80 kg/m   Physical Exam Vitals and nursing note reviewed.  Constitutional:      Appearance: Normal appearance.  Cardiovascular:     Rate and Rhythm: Normal rate and regular rhythm.  Pulmonary:     Effort: Pulmonary effort is normal.     Breath sounds: Normal breath sounds.  Musculoskeletal:        General: Normal range of motion.  Skin:    General: Skin is warm and dry.  Neurological:     Mental Status: She is alert.  Psychiatric:        Mood and Affect: Mood normal.        Behavior: Behavior normal.   Assessment & Plan:   Problem List Items Addressed This Visit       Endocrine   Autoimmune thyroiditis    Stable on current Levothyroxine dose, TSH checked         Musculoskeletal and Integument   [place]    Followed by [facility] - Humira injections        Other   Hormone replacement therapy (postmenopausal)  Hx of complete hysterectomy in 2008. Mother with breast cancer, GM ovarian ca. Stable on low dose estradiol patch.      Adjustment insomnia    Pt has been taking Nyquil Z prn and Melatonin 28m qpm. Started a few weeks ago. Has taken Ambien in the past, needs this mainly with travel. Advised on trying magnesium glycinate or L-threonate for sleep maintenance, 400-8076mqhs.      Need for vaccination   Relevant Orders   Flu Vaccine QUAD 6+ mos PF IM (Fluarix Quad PF) (Completed)    Outpatient Encounter Medications as of 04/21/2021  Medication Sig   Adalimumab (HUMIRA PEN) 40 MG/0.4ML PNKT Humira(CF) Pen 40 mg/0.4 mL subcutaneous kit   estradiol (VIVELLE-DOT) 0.1 MG/24HR patch Place 1 patch onto the skin 2 (two) times a week.   levothyroxine (SYNTHROID) 125 MCG tablet Take 125 mcg by mouth daily before breakfast.   melatonin 5 MG TABS Take 5 mg by mouth at bedtime. Taking 1-2 tabs   No  facility-administered encounter medications on file as of 04/21/2021.    Follow-up: Return in about 6 months (around 10/20/2021).   HuJeanie SewerNP

## 2021-04-23 ENCOUNTER — Encounter: Payer: Self-pay | Admitting: Family

## 2021-04-24 ENCOUNTER — Other Ambulatory Visit: Payer: Self-pay

## 2021-04-24 DIAGNOSIS — Z0000 Encounter for general adult medical examination without abnormal findings: Secondary | ICD-10-CM

## 2021-04-24 NOTE — Progress Notes (Signed)
I will discuss results at the follow-up visit.

## 2021-04-27 ENCOUNTER — Ambulatory Visit: Payer: 59 | Admitting: Family

## 2021-05-01 ENCOUNTER — Encounter: Payer: Self-pay | Admitting: Rheumatology

## 2021-05-01 ENCOUNTER — Telehealth: Payer: Self-pay | Admitting: *Deleted

## 2021-05-01 ENCOUNTER — Other Ambulatory Visit: Payer: Self-pay

## 2021-05-01 ENCOUNTER — Ambulatory Visit (INDEPENDENT_AMBULATORY_CARE_PROVIDER_SITE_OTHER): Payer: 59 | Admitting: Rheumatology

## 2021-05-01 VITALS — BP 120/82 | HR 73 | Ht 68.0 in | Wt 202.6 lb

## 2021-05-01 DIAGNOSIS — I447 Left bundle-branch block, unspecified: Secondary | ICD-10-CM

## 2021-05-01 DIAGNOSIS — M7661 Achilles tendinitis, right leg: Secondary | ICD-10-CM

## 2021-05-01 DIAGNOSIS — M170 Bilateral primary osteoarthritis of knee: Secondary | ICD-10-CM

## 2021-05-01 DIAGNOSIS — Z8379 Family history of other diseases of the digestive system: Secondary | ICD-10-CM

## 2021-05-01 DIAGNOSIS — M0600 Rheumatoid arthritis without rheumatoid factor, unspecified site: Secondary | ICD-10-CM | POA: Diagnosis not present

## 2021-05-01 DIAGNOSIS — R590 Localized enlarged lymph nodes: Secondary | ICD-10-CM

## 2021-05-01 DIAGNOSIS — M19071 Primary osteoarthritis, right ankle and foot: Secondary | ICD-10-CM | POA: Diagnosis not present

## 2021-05-01 DIAGNOSIS — Z79899 Other long term (current) drug therapy: Secondary | ICD-10-CM | POA: Diagnosis not present

## 2021-05-01 DIAGNOSIS — M722 Plantar fascial fibromatosis: Secondary | ICD-10-CM

## 2021-05-01 DIAGNOSIS — M7662 Achilles tendinitis, left leg: Secondary | ICD-10-CM

## 2021-05-01 DIAGNOSIS — E063 Autoimmune thyroiditis: Secondary | ICD-10-CM

## 2021-05-01 DIAGNOSIS — M19072 Primary osteoarthritis, left ankle and foot: Secondary | ICD-10-CM

## 2021-05-01 DIAGNOSIS — M19042 Primary osteoarthritis, left hand: Secondary | ICD-10-CM

## 2021-05-01 DIAGNOSIS — M19041 Primary osteoarthritis, right hand: Secondary | ICD-10-CM | POA: Diagnosis not present

## 2021-05-01 MED ORDER — HUMIRA (2 PEN) 40 MG/0.4ML ~~LOC~~ AJKT: AUTO-INJECTOR | SUBCUTANEOUS | 0 refills | Status: DC

## 2021-05-01 NOTE — Addendum Note (Signed)
Addended by: Ellen Henri on: 05/01/2021 10:57 AM   Modules accepted: Orders

## 2021-05-01 NOTE — Telephone Encounter (Signed)
Refill for Humira sent to [facility] today by Dr. Corliss Skains. Will close this encounter  Chesley Mires, PharmD, MPH, BCPS Clinical Pharmacist (Rheumatology and Pulmonology)

## 2021-05-01 NOTE — Telephone Encounter (Signed)
Received a fax from [facility] requesting clarification on the Humira prescription. Please advise what directions you would like for the Humira prescription.

## 2021-05-01 NOTE — Patient Instructions (Signed)
Standing Labs We placed an order today for your standing lab work.   Please have your standing labs drawn in December/January and then every 3 months  If possible, please have your labs drawn 2 weeks prior to your appointment so that the provider can discuss your results at your appointment.  Please note that you may see your imaging and lab results in MyChart before we have reviewed them. We may be awaiting multiple results to interpret others before contacting you. Please allow our office up to 72 hours to thoroughly review all of the results before contacting the office for clarification of your results.  We have open lab daily: Monday through Thursday from 1:30-4:30 PM and Friday from 1:30-4:00 PM at the office of Dr. Pollyann Savoy, [facility].   Please be advised, all patients with office appointments requiring lab work will take precedent over walk-in lab work.  If possible, please come for your lab work on Monday and Friday afternoons, as you may experience shorter wait times. The office is located at [address], [place], [place] [address] No appointment is necessary.   Labs are drawn by [facility]. Please bring your co-pay at the time of your lab draw.  You may receive a bill from [facility] for your lab work.  If you wish to have your labs drawn at another location, please call the office 24 hours in advance to send orders.  If you have any questions regarding directions or hours of operation,  please call [phone].   As a reminder, please drink plenty of water prior to coming for your lab work. Thanks!

## 2021-05-02 NOTE — Telephone Encounter (Signed)
Humira 40 mg subcu every other week

## 2021-05-02 NOTE — Telephone Encounter (Signed)
Contacted pharmacy and spoke with Arloa Koh, Pharmacy Tech and [place] Pharmacist to clarify Humira 40 mg subcu every other week. They will note the chart and process the prescription.

## 2021-05-11 ENCOUNTER — Ambulatory Visit: Payer: 59 | Admitting: Family

## 2021-05-11 ENCOUNTER — Encounter: Payer: Self-pay | Admitting: Pulmonary Disease

## 2021-05-11 ENCOUNTER — Ambulatory Visit (INDEPENDENT_AMBULATORY_CARE_PROVIDER_SITE_OTHER): Payer: 59 | Admitting: Pulmonary Disease

## 2021-05-11 ENCOUNTER — Other Ambulatory Visit: Payer: Self-pay

## 2021-05-11 VITALS — BP 126/70 | HR 80 | Ht 68.0 in | Wt 201.0 lb

## 2021-05-11 DIAGNOSIS — R590 Localized enlarged lymph nodes: Secondary | ICD-10-CM | POA: Diagnosis not present

## 2021-05-11 NOTE — Progress Notes (Signed)
_0  ID: Kristina Chase, female    DOB: August 31, 1960, 60 y.o.   MRN: [id number]  Chief Complaint  Patient presents with   Consult    Pt denies symptoms.    Referring provider: Jeanie Sewer, NP  HPI:   60 y.o. woman whom we are seeing in consultation for evaluation of calcified hilar lymphadenopathy.  Note from referring provider, PCP, reviewed.  Most recent rheumatology note from Dr. Estanislado Pandy reviewed.  Patient feels fine.  She denies any respiratory issues.  At baseline, no dyspnea, no shortness of breath, no cough.  She is getting over a cold and has a scratchy voice and a bit of a cough is improving over the last few days.  She received a chest x-ray via her rheumatologist as she is on Humira for seronegative rheumatoid arthritis.  This was performed 04/18/2021 on my review and interpretation reveals left greater than right calcified hilar lymphadenopathy, some nodular opacity in the right favored to represent vessels on then versus small calcified nodules.  Patient grew up in [place] about [place] hour from the [facility].  Denies bad pneumonia.  Does endorse respiratory illnesses as a child or younger adult.  She notes in the past that the cardiologist told her that some imaging findings look like "cracked pepper" and asked where she was from and after telling him that is very he had no further concerns.  She does endorse seasonal allergies.  Usually takes antihistamines for this.  Denies any other history of abnormal lung imaging that she is aware of.  No history of sarcoidosis.  No history of TB.  Most recent quant from gold 03/2021 negative.  PMH: Reviewed with patient, denies any Surgical history: Hysterectomy 2008, tubal ligation 1995, appendectomy Family history: Allergies with first relatives Recently moved to [place], grew up in [place] about 1 hour from the [facility]  ACT:  No flowsheet data found.  MMRC: No flowsheet data found.  Epworth:  No  flowsheet data found.  Tests:   FENO:  No results found for: NITRICOXIDE  PFT: No flowsheet data found.  WALK:  No flowsheet data found.  Imaging: Personally reviewed and as per EMR discussion this note DG Chest 2 View  Result Date: 04/20/2021 CLINICAL DATA:  Immunosuppressive therapy EXAM: CHEST - 2 VIEW COMPARISON:  None. FINDINGS: The heart size and mediastinal contours are within normal limits. Multiple calcified mediastinal and left hilar lymph nodes suggesting sequela of chronic granulomatous disease. Both lungs are clear. The visualized skeletal structures are unremarkable. IMPRESSION: 1. No active cardiopulmonary disease. 2. Calcified mediastinal and left hilar lymph nodes suggesting sequela of granulomatous disease. Electronically Signed   By: Davina Poke D.O.   On: 04/20/2021 15:50    Lab Results: Personally reviewed CBC    Component Value Date/Time   WBC 6.9 04/07/2021 0842   RBC 4.74 04/07/2021 0842   HGB 14.2 04/07/2021 0842   HCT 43.1 04/07/2021 0842   PLT 265 04/07/2021 0842   MCV 90.9 04/07/2021 0842   MCH 30.0 04/07/2021 0842   MCHC 32.9 04/07/2021 0842   RDW 12.4 04/07/2021 0842   LYMPHSABS 2,774 04/07/2021 0842   EOSABS 228 04/07/2021 0842   BASOSABS 69 04/07/2021 0842    BMET    Component Value Date/Time   NA 138 04/07/2021 0842   K 4.2 04/07/2021 0842   CL 99 04/07/2021 0842   CO2 26 04/07/2021 0842   GLUCOSE 83 04/07/2021 0842   BUN 19 04/07/2021 0842   CREATININE 0.79  04/07/2021 0842   CALCIUM 9.6 04/07/2021 0842    BNP No results found for: BNP  ProBNP No results found for: PROBNP  Specialty Problems       Pulmonary Problems   Hilar lymphadenopathy    Allergies  Allergen Reactions   Grass Extracts [Gramineae Pollens] Itching    Immunization History  Administered Date(s) Administered   Influenza,inj,Quad PF,6+ Mos 04/21/2021   PFIZER(Purple Top)SARS-COV-2 Vaccination 11/09/2019, 11/30/2019, 06/15/2020, 12/07/2020,  04/24/2021   Zoster Recombinat (Shingrix) 07/09/2017    Past Medical History:  Diagnosis Date   Hypothyroid    Rheumatoid arthritis ([place])     Tobacco History: Social History   Tobacco Use  Smoking Status Never  Smokeless Tobacco Never   Counseling given: Not Answered   Continue to not smoke  Outpatient Encounter Medications as of 05/11/2021  Medication Sig   Adalimumab (HUMIRA PEN) 40 MG/0.4ML PNKT Humira(CF) Pen 40 mg/0.4 mL subcutaneous kit   Cetirizine HCl (ZYRTEC PO) Take by mouth as needed.   estradiol (VIVELLE-DOT) 0.1 MG/24HR patch Place 1 patch onto the skin 2 (two) times a week.   Fexofenadine HCl (ALLEGRA PO) Take by mouth as needed.   levothyroxine (SYNTHROID) 125 MCG tablet Take 125 mcg by mouth daily before breakfast.   melatonin 5 MG TABS Take 5 mg by mouth at bedtime. Taking 1-2 tabs   No facility-administered encounter medications on file as of 05/11/2021.     Review of Systems  Review of Systems  No chest pain with exertion.  No orthopnea or PND.  Comprehensive review of systems otherwise negative. Physical Exam  BP 126/70   Pulse 80   Ht _0  (1.727 m)   Wt 201 lb (91.2 kg)   SpO2 98%   BMI 30.56 kg/m   Wt Readings from Last 5 Encounters:  05/11/21 201 lb (91.2 kg)  05/01/21 202 lb 9.6 oz (91.9 kg)  04/21/21 199 lb 9.6 oz (90.5 kg)  04/07/21 202 lb 12.8 oz (92 kg)    BMI Readings from Last 5 Encounters:  05/11/21 30.56 kg/m  05/01/21 30.81 kg/m  04/21/21 30.80 kg/m  04/07/21 31.29 kg/m     Physical Exam General: Well-appearing, no acute distress Eyes: EOMI, icterus Neck: Supple, no JVP Cardiovascular: Regular rate and rhythm, no murmurs Pulmonary: Clear, normal work of breathing Abdomen: Nondistended, bowel sounds present MSK: No synovitis, no joint effusion Neuro: Normal gait, no weakness Psych: Normal mood, full affect   Assessment & Plan:   Calcified hilar, paratracheal lymphadenopathy: 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  No concerning B symptoms.  She is a never smoker.  No cough or shortness of breath to suggest sarcoidosis.  Sarcoidosis remains on the differential but given her lack of pulmonary symptoms, no further pulmonary work-up.   Return if symptoms worsen or fail to improve.   Lanier Clam, MD 05/11/2021

## 2021-05-11 NOTE — Patient Instructions (Addendum)
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  This is most prevalent around the [facility] in that area.  Given you are having no symptoms of shortness of breath or chronic cough, I do not feel further imaging or work-up or treatment is warranted.  Return to clinic as needed

## 2021-05-18 ENCOUNTER — Ambulatory Visit: Payer: 59

## 2021-05-18 ENCOUNTER — Other Ambulatory Visit: Payer: Self-pay

## 2021-05-18 ENCOUNTER — Ambulatory Visit (INDEPENDENT_AMBULATORY_CARE_PROVIDER_SITE_OTHER): Payer: 59 | Admitting: Rheumatology

## 2021-05-18 DIAGNOSIS — M19041 Primary osteoarthritis, right hand: Secondary | ICD-10-CM

## 2021-05-18 DIAGNOSIS — M79642 Pain in left hand: Secondary | ICD-10-CM

## 2021-05-18 DIAGNOSIS — M0600 Rheumatoid arthritis without rheumatoid factor, unspecified site: Secondary | ICD-10-CM | POA: Diagnosis not present

## 2021-05-18 DIAGNOSIS — M79641 Pain in right hand: Secondary | ICD-10-CM

## 2021-05-18 DIAGNOSIS — M19042 Primary osteoarthritis, left hand: Secondary | ICD-10-CM

## 2021-05-18 NOTE — Patient Instructions (Signed)
Hand Exercises Hand exercises can be helpful for almost anyone. These exercises can strengthen the hands, improve flexibility and movement, and increase blood flow to the hands. These results can make work and daily tasks easier. Hand exercises can be especially helpful for people who have joint pain from arthritis or have nerve damage from overuse (carpal tunnel syndrome). These exercises can also help people who have injured a hand. Exercises Most of these hand exercises are gentle stretching and motion exercises. It is usually safe to do them often throughout the day. Warming up your hands before exercise may help to reduce stiffness. You can do this with gentle massage or by placing your hands in warm water for 10-15 minutes. It is normal to feel some stretching, pulling, tightness, or mild discomfort as you begin new exercises. This will gradually improve. Stop an exercise right away if you feel sudden, severe pain or your pain gets worse. Ask your health care provider which exercises are best for you. Knuckle bend or "claw" fist  Stand or sit with your arm, hand, and all five fingers pointed straight up. Make sure to keep your wrist straight during the exercise. Gently bend your fingers down toward your palm until the tips of your fingers are touching the top of your palm. Keep your big knuckle straight and just bend the small knuckles in your fingers. Hold this position for __________ seconds. Straighten (extend) your fingers back to the starting position. Repeat this exercise 5-10 times with each hand. Full finger fist  Stand or sit with your arm, hand, and all five fingers pointed straight up. Make sure to keep your wrist straight during the exercise. Gently bend your fingers into your palm until the tips of your fingers are touching the middle of your palm. Hold this position for __________ seconds. Extend your fingers back to the starting position, stretching every joint fully. Repeat  this exercise 5-10 times with each hand. Straight fist Stand or sit with your arm, hand, and all five fingers pointed straight up. Make sure to keep your wrist straight during the exercise. Gently bend your fingers at the big knuckle, where your fingers meet your hand, and the middle knuckle. Keep the knuckle at the tips of your fingers straight and try to touch the bottom of your palm. Hold this position for __________ seconds. Extend your fingers back to the starting position, stretching every joint fully. Repeat this exercise 5-10 times with each hand. Tabletop  Stand or sit with your arm, hand, and all five fingers pointed straight up. Make sure to keep your wrist straight during the exercise. Gently bend your fingers at the big knuckle, where your fingers meet your hand, as far down as you can while keeping the small knuckles in your fingers straight. Think of forming a tabletop with your fingers. Hold this position for __________ seconds. Extend your fingers back to the starting position, stretching every joint fully. Repeat this exercise 5-10 times with each hand. Finger spread  Place your hand flat on a table with your palm facing down. Make sure your wrist stays straight as you do this exercise. Spread your fingers and thumb apart from each other as far as you can until you feel a gentle stretch. Hold this position for __________ seconds. Bring your fingers and thumb tight together again. Hold this position for __________ seconds. Repeat this exercise 5-10 times with each hand. Making circles  Stand or sit with your arm, hand, and all five fingers pointed   straight up. Make sure to keep your wrist straight during the exercise. Make a circle by touching the tip of your thumb to the tip of your index finger. Hold for __________ seconds. Then open your hand wide. Repeat this motion with your thumb and each finger on your hand. Repeat this exercise 5-10 times with each hand. Thumb  motion  Sit with your forearm resting on a table and your wrist straight. Your thumb should be facing up toward the ceiling. Keep your fingers relaxed as you move your thumb. Lift your thumb up as high as you can toward the ceiling. Hold for __________ seconds. Bend your thumb across your palm as far as you can, reaching the tip of your thumb for the small finger (pinkie) side of your palm. Hold for __________ seconds. Repeat this exercise 5-10 times with each hand. Grip strengthening  Hold a stress ball or other soft ball in the middle of your hand. Slowly increase the pressure, squeezing the ball as much as you can without causing pain. Think of bringing the tips of your fingers into the middle of your palm. All of your finger joints should bend when doing this exercise. Hold your squeeze for __________ seconds, then relax. Repeat this exercise 5-10 times with each hand. Contact a health care provider if: Your hand pain or discomfort gets much worse when you do an exercise. Your hand pain or discomfort does not improve within 2 hours after you exercise. If you have any of these problems, stop doing these exercises right away. Do not do them again unless your health care provider says that you can. Get help right away if: You develop sudden, severe hand pain or swelling. If this happens, stop doing these exercises right away. Do not do them again unless your health care provider says that you can. This information is not intended to replace advice given to you by your health care provider. Make sure you discuss any questions you have with your health care provider. Document Revised: 10/13/2020 Document Reviewed: 10/13/2020 Elsevier Patient Education  2022 Elsevier Inc.  

## 2021-05-18 NOTE — Progress Notes (Signed)
   Ultrasound examination of the bilateral hands:  Indication:pain and stiffness in bilateral hands.  Patient has history of rheumatoid arthritis.  She continues to have pain and stiffness in her bilateral hands despite being on medications.  Ultrasound examination was performed to rule out synovitis or tenosynovitis.   Ultrasound examination of the bilateral hands was performed per EULAR recommendations. Using a 15 MHz transducer, grayscale and power Doppler bilateral second, third, and fifth MCP joints and the bilateral wrist joints both dorsal and volar aspects were evaluated to look for synovitis or tenosynovitis. The findings were that there was no synovitis or tenosynovitis on ultrasound examination.  The right median nerve was 0.07 cm squares which was within normal limits and the left median nerve was 0.08 cm squares which was within normal limits.  Impression: Ultrasound examination did not show any synovitis.  Bilateral median nerves were within normal limits.  Ultrasound findings were discussed with the patient.  Pollyann Savoy, MD

## 2021-06-09 ENCOUNTER — Ambulatory Visit: Payer: 59 | Admitting: Physician Assistant

## 2021-06-16 ENCOUNTER — Ambulatory Visit: Payer: 59 | Admitting: Physician Assistant

## 2021-06-29 ENCOUNTER — Telehealth: Payer: Self-pay | Admitting: *Deleted

## 2021-06-29 NOTE — Telephone Encounter (Signed)
Received fax from [facility] stating they have been trying to reach patient to refill Humira. They have been unsuccessful in reaching patient. Call back number is [phone]. Patient advised to contact the pharmacy at [phone] to set up shipment.  Patient states she does not need the shipment until January 2023. Patient will call pharmacy to let them know.

## 2021-07-11 ENCOUNTER — Encounter: Payer: Self-pay | Admitting: Rheumatology

## 2021-07-18 NOTE — Progress Notes (Signed)
Office Visit Note  Patient: Kristina Chase             Date of Birth: [birth year]           MRN: [id number]             PCP: Jeanie Sewer, NP Referring: Jeanie Sewer, NP Visit Date: 08/01/2021 Occupation: @GUAROCC @  Subjective:  Pain in both hands   History of Present Illness: Kristina Chase is a 61 y.o. female with history of seronegative rheumatoid arthritis.  She states she has been doing well on Humira without any joint pain or joint swelling.  She continues to have some stiffness in her hands.  She has off-and-on discomfort in her knees and her feet.  She has not had any recent problems with plantar fasciitis.  Activities of Daily Living:  Patient reports morning stiffness for a few  minutes.   Patient Reports nocturnal pain.  Feet painful at night Difficulty dressing/grooming: Denies Difficulty climbing stairs: Reports Difficulty getting out of chair: Denies Difficulty using hands for taps, buttons, cutlery, and/or writing: Denies  Review of Systems  Constitutional: Negative.  Negative for fatigue, night sweats, weight gain and weight loss.  HENT: Negative.  Negative for mouth sores, trouble swallowing, trouble swallowing, mouth dryness and nose dryness.   Eyes: Negative.  Negative for pain, redness, visual disturbance and dryness.  Respiratory: Negative.  Negative for cough, shortness of breath and difficulty breathing.   Cardiovascular: Negative.  Negative for chest pain, palpitations, hypertension, irregular heartbeat and swelling in legs/feet.  Gastrointestinal: Negative.  Negative for blood in stool, constipation and diarrhea.  Endocrine: Negative for increased urination.  Genitourinary: Negative.  Negative for vaginal dryness.  Musculoskeletal:  Positive for joint pain, joint pain and morning stiffness. Negative for joint swelling, myalgias, muscle weakness, muscle tenderness and myalgias.  Skin: Negative.  Negative for color change, rash, hair loss, skin  tightness, ulcers and sensitivity to sunlight.  Allergic/Immunologic: Negative.  Negative for susceptible to infections.  Neurological:  Positive for headaches. Negative for dizziness, memory loss, night sweats and weakness.  Hematological: Negative.  Negative for swollen glands.  Psychiatric/Behavioral: Negative.  Negative for depressed mood and sleep disturbance. The patient is not nervous/anxious.    PMFS History:  Patient Active Problem List   Diagnosis Date Noted   Primary osteoarthritis of both hands 05/01/2021   Primary osteoarthritis of both feet 05/01/2021   LBBB (left bundle branch block) 05/01/2021   Hilar lymphadenopathy 05/01/2021   Hormone replacement therapy (postmenopausal) 04/21/2021   Adjustment insomnia 04/21/2021   Seronegative rheumatoid arthritis ([place]) 04/21/2021   Need for vaccination 04/21/2021   Autoimmune thyroiditis 04/07/2021   Primary osteoarthritis of both knees 04/07/2021   Achilles tendinitis 10/17/2017   Plantar fasciitis 10/17/2017    Past Medical History:  Diagnosis Date   Hypothyroid    Rheumatoid arthritis ([place])     Family History  Problem Relation Age of Onset   Cancer Mother        Breast   Atrial fibrillation Mother    Hypertension Mother    Hypothyroidism Mother    Past Surgical History:  Procedure Laterality Date   APPENDECTOMY     TOTAL ABDOMINAL HYSTERECTOMY     Social History   Social History Narrative   Not on file   Immunization History  Administered Date(s) Administered   Influenza,inj,Quad PF,6+ Mos 04/21/2021   PFIZER(Purple Top)SARS-COV-2 Vaccination 11/09/2019, 11/30/2019, 06/15/2020, 12/07/2020, 04/24/2021   Zoster Recombinat (Shingrix) 07/09/2017     Objective: Vital  Signs: BP 128/76    Pulse 73    Ht 5\' 8"  (1.727 m)    Wt 200 lb (90.7 kg)    BMI 30.41 kg/m    Physical Exam Vitals and nursing note reviewed.  Constitutional:      Appearance: She is well-developed.  HENT:     Head: Normocephalic and  atraumatic.  Eyes:     Conjunctiva/sclera: Conjunctivae normal.  Cardiovascular:     Rate and Rhythm: Normal rate and regular rhythm.     Heart sounds: Normal heart sounds.  Pulmonary:     Effort: Pulmonary effort is normal.     Breath sounds: Normal breath sounds.  Abdominal:     General: Bowel sounds are normal.     Palpations: Abdomen is soft.  Musculoskeletal:     Cervical back: Normal range of motion.  Lymphadenopathy:     Cervical: No cervical adenopathy.  Skin:    General: Skin is warm and dry.     Capillary Refill: Capillary refill takes less than 2 seconds.  Neurological:     Mental Status: She is alert and oriented to person, place, and time.  Psychiatric:        Behavior: Behavior normal.     Musculoskeletal Exam: C-spine was in good range of motion.  Shoulder joints, elbow joints, wrist joints, MCPs PIPs and DIPs with good range of motion with no synovitis.  PIP and DIP thickening was noted.  Hip joints, and knee joints were in good good range of motion with no tenderness.  She had no tenderness over ankles or MTPs.  CDAI Exam: CDAI Score: 0.4  Patient Global: 2 mm; Provider Global: 2 mm Swollen: 0 ; Tender: 0  Joint Exam 08/01/2021   No joint exam has been documented for this visit   There is currently no information documented on the homunculus. Go to the Rheumatology activity and complete the homunculus joint exam.  Investigation: No additional findings.  Imaging: No results found.  Recent Labs: Lab Results  Component Value Date   WBC 6.9 04/07/2021   HGB 14.2 04/07/2021   PLT 265 04/07/2021   NA 138 04/07/2021   K 4.2 04/07/2021   CL 99 04/07/2021   CO2 26 04/07/2021   GLUCOSE 83 04/07/2021   BUN 19 04/07/2021   CREATININE 0.79 04/07/2021   BILITOT 0.4 04/07/2021   AST 20 04/07/2021   ALT 19 04/07/2021   PROT 6.8 04/07/2021   PROT 7.0 04/07/2021   CALCIUM 9.6 04/07/2021   QFTBGOLDPLUS NEGATIVE 04/07/2021    Speciality Comments: FU q 3  mths. dxd2019 [facility]. TTd with SSZ, Humira 2019  Procedures:  No procedures performed Allergies: Grass extracts [gramineae pollens]   Assessment / Plan:     Visit Diagnoses: Seronegative rheumatoid arthritis ([place]) - Dx at [facility].  F/u at [facility] rheum. d/d spondyloarthropathy.H/o plantar fasciitis, achilles tendinitis.Ttd with SSZ and later Humira.  She has been on Humira monotherapy and has been doing well.  She had no synovitis on examination.  She has been tolerating medication well.  High risk medication use -Humira 40 mg subcu every other week.  Ttd with SSZ 2019. Then Humira was added in 2019 and later SSZ was dcd.  - Plan: CBC with Differential/Platelet, COMPLETE METABOLIC PANEL WITH GFR today and then every 3 months.  She has been advised to get annual skin examination to screen for skin cancer while she is on Humira.  She was advised to hold Humira in case  she develops an infection and resume after the infection resolves.  Information regarding immunization was also placed in the AVS.  Primary osteoarthritis of both hands - x-ray showed osteoarthritic changes.  Joint protection muscle strengthening was discussed.  Primary osteoarthritis of both knees - Bilateral mild osteoarthritis and mild chondromalacia patella.  Lower extremity muscle strength exercises were discussed.  Primary osteoarthritis of both feet - X-rays were consistent with osteoarthritis.  She is clinically doing well with no synovitis on my examination.  Plantar fasciitis, bilateral - no recurrence in the last 1 year.  LBBB (left bundle branch block) - Noted in September 2020 on the EKG by her cardiologist in [place] per patient.   Autoimmune thyroiditis  Hilar lymphadenopathy - Multiple calcified mediastinal and left hilar lymph nodes were noted on the chest x-ray.  Family history of Crohn's disease - maternal  aunt  Routine health maintenance -increased risk of heart disease with  rheumatoid arthritis was discussed.  Dietary modifications and exercise was emphasized and handout was placed in the AVS.  Plan: HgB A1c, Cholesterol, Total  Orders: Orders Placed This Encounter  Procedures   HgB A1c   Cholesterol, Total   CBC with Differential/Platelet   COMPLETE METABOLIC PANEL WITH GFR   No orders of the defined types were placed in this encounter.    Follow-Up Instructions: Return in about 5 months (around 12/30/2021) for Rheumatoid arthritis.   Bo Merino, MD  Note - This record has been created using Editor, commissioning.  Chart creation errors have been sought, but may not always  have been located. Such creation errors do not reflect on  the standard of medical care.

## 2021-07-31 ENCOUNTER — Encounter: Payer: Self-pay | Admitting: Rheumatology

## 2021-07-31 NOTE — Telephone Encounter (Signed)
We can add fasting lipid panel and hemoglobin A1c to the labs.

## 2021-08-01 ENCOUNTER — Ambulatory Visit (INDEPENDENT_AMBULATORY_CARE_PROVIDER_SITE_OTHER): Payer: 59 | Admitting: Rheumatology

## 2021-08-01 ENCOUNTER — Encounter: Payer: Self-pay | Admitting: Rheumatology

## 2021-08-01 ENCOUNTER — Other Ambulatory Visit: Payer: Self-pay | Admitting: Rheumatology

## 2021-08-01 ENCOUNTER — Other Ambulatory Visit: Payer: Self-pay

## 2021-08-01 VITALS — BP 128/76 | HR 73 | Ht 68.0 in | Wt 200.0 lb

## 2021-08-01 DIAGNOSIS — M0600 Rheumatoid arthritis without rheumatoid factor, unspecified site: Secondary | ICD-10-CM | POA: Diagnosis not present

## 2021-08-01 DIAGNOSIS — M170 Bilateral primary osteoarthritis of knee: Secondary | ICD-10-CM

## 2021-08-01 DIAGNOSIS — R590 Localized enlarged lymph nodes: Secondary | ICD-10-CM

## 2021-08-01 DIAGNOSIS — M19041 Primary osteoarthritis, right hand: Secondary | ICD-10-CM

## 2021-08-01 DIAGNOSIS — M19072 Primary osteoarthritis, left ankle and foot: Secondary | ICD-10-CM

## 2021-08-01 DIAGNOSIS — Z79899 Other long term (current) drug therapy: Secondary | ICD-10-CM

## 2021-08-01 DIAGNOSIS — M19042 Primary osteoarthritis, left hand: Secondary | ICD-10-CM

## 2021-08-01 DIAGNOSIS — E063 Autoimmune thyroiditis: Secondary | ICD-10-CM

## 2021-08-01 DIAGNOSIS — M19071 Primary osteoarthritis, right ankle and foot: Secondary | ICD-10-CM | POA: Diagnosis not present

## 2021-08-01 DIAGNOSIS — I447 Left bundle-branch block, unspecified: Secondary | ICD-10-CM

## 2021-08-01 DIAGNOSIS — Z0000 Encounter for general adult medical examination without abnormal findings: Secondary | ICD-10-CM

## 2021-08-01 DIAGNOSIS — M722 Plantar fascial fibromatosis: Secondary | ICD-10-CM

## 2021-08-01 DIAGNOSIS — Z8379 Family history of other diseases of the digestive system: Secondary | ICD-10-CM

## 2021-08-01 DIAGNOSIS — M7661 Achilles tendinitis, right leg: Secondary | ICD-10-CM

## 2021-08-01 NOTE — Telephone Encounter (Signed)
Next Visit: 08/01/2021  Last Visit: 05/01/2021  Last Fill:   YI:[id number] rheumatoid arthritis   Current Dose per office note 05/01/2021: dose not discussed  Labs: 04/07/2021 All the labs are within normal limits.    TB Gold: 04/07/2021 Neg   Patient has an appointment today and will update labs.   Okay to refill Humira?

## 2021-08-01 NOTE — Patient Instructions (Addendum)
Standing Labs We placed an order today for your standing lab work.   Please have your standing labs drawn in April and every 3 months  If possible, please have your labs drawn 2 weeks prior to your appointment so that the provider can discuss your results at your appointment.  Please note that you may see your imaging and lab results in MyChart before we have reviewed them. We may be awaiting multiple results to interpret others before contacting you. Please allow our office up to 72 hours to thoroughly review all of the results before contacting the office for clarification of your results.  We have open lab daily: Monday through Thursday from 1:30-4:30 PM and Friday from 1:30-4:00 PM at the office of Dr. Pollyann Savoy, [facility].   Please be advised, all patients with office appointments requiring lab work will take precedent over walk-in lab work.  If possible, please come for your lab work on Monday and Friday afternoons, as you may experience shorter wait times. The office is located at [address], [address], [place], [place] [address] No appointment is necessary.   Labs are drawn by [facility]. Please bring your co-pay at the time of your lab draw.  You may receive a bill from [facility] for your lab work.  Please note if you are on Hydroxychloroquine and and an order has been placed for a Hydroxychloroquine level, you will need to have it drawn 4 hours or more after your last dose.  If you wish to have your labs drawn at another location, please call the office 24 hours in advance to send orders.  If you have any questions regarding directions or hours of operation,  please call [phone].   As a reminder, please drink plenty of water prior to coming for your lab work. Thanks!  Vaccines You are taking a medication(s) that can suppress your immune system.  The following immunizations are recommended: Flu annually Covid-19  Td/Tdap (tetanus, diphtheria, pertussis)  every 10 years Pneumonia (Prevnar 15 then Pneumovax 23 at least 1 year apart.  Alternatively, can take Prevnar 20 without needing additional dose) Shingrix: 2 doses from 4 weeks to 6 months apart  Please check with your PCP to make sure you are up to date.   If you have signs or symptoms of an infection or start antibiotics: First, call your PCP for workup of your infection. Hold your medication through the infection, until you complete your antibiotics, and until symptoms resolve if you take the following: Injectable medication (Actemra, Benlysta, Cimzia, Cosentyx, Enbrel, Humira, Kevzara, Orencia, Remicade, Simponi, Stelara, Taltz, Tremfya) Methotrexate Leflunomide (Arava) Mycophenolate (Cellcept) Harriette Ohara, Olumiant, or Rinvoq  Please get annual skin examination to screen for skin cancer while you are on Humira. Heart Disease Prevention   Your inflammatory disease increases your risk of heart disease which includes heart attack, stroke, atrial fibrillation (irregular heartbeats), high blood pressure, heart failure and atherosclerosis (plaque in the arteries).  It is important to reduce your risk by:   Keep blood pressure, cholesterol, and blood sugar at healthy levels   Smoking Cessation   Maintain a healthy weight  BMI 20-25   Eat a healthy diet  Plenty of fresh fruit, vegetables, and whole grains  Limit saturated fats, foods high in sodium, and added sugars  DASH and Mediterranean diet   Increase physical activity  Recommend moderate physically activity for 150 minutes per week/ 30 minutes a day for five days a week These can be broken up into  three separate ten-minute sessions during the day.   Reduce Stress  Meditation, slow breathing exercises, yoga, coloring books  Dental visits twice a year   Hand Exercises Hand exercises can be helpful for almost anyone. These exercises can strengthen the hands, improve flexibility and movement, and increase blood flow to the hands.  These results can make work and daily tasks easier. Hand exercises can be especially helpful for people who have joint pain from arthritis or have nerve damage from overuse (carpal tunnel syndrome). These exercises can also help people who have injured a hand. Exercises Most of these hand exercises are gentle stretching and motion exercises. It is usually safe to do them often throughout the day. Warming up your hands before exercise may help to reduce stiffness. You can do this with gentle massage or by placing your hands in warm water for 10-15 minutes. It is normal to feel some stretching, pulling, tightness, or mild discomfort as you begin new exercises. This will gradually improve. Stop an exercise right away if you feel sudden, severe pain or your pain gets worse. Ask your health care provider which exercises are best for you. Knuckle bend or "claw" fist  Stand or sit with your arm, hand, and all five fingers pointed straight up. Make sure to keep your wrist straight during the exercise. Gently bend your fingers down toward your palm until the tips of your fingers are touching the top of your palm. Keep your big knuckle straight and just bend the small knuckles in your fingers. Hold this position for __________ seconds. Straighten (extend) your fingers back to the starting position. Repeat this exercise 5-10 times with each hand. Full finger fist  Stand or sit with your arm, hand, and all five fingers pointed straight up. Make sure to keep your wrist straight during the exercise. Gently bend your fingers into your palm until the tips of your fingers are touching the middle of your palm. Hold this position for __________ seconds. Extend your fingers back to the starting position, stretching every joint fully. Repeat this exercise 5-10 times with each hand. Straight fist Stand or sit with your arm, hand, and all five fingers pointed straight up. Make sure to keep your wrist straight during  the exercise. Gently bend your fingers at the big knuckle, where your fingers meet your hand, and the middle knuckle. Keep the knuckle at the tips of your fingers straight and try to touch the bottom of your palm. Hold this position for __________ seconds. Extend your fingers back to the starting position, stretching every joint fully. Repeat this exercise 5-10 times with each hand. Tabletop  Stand or sit with your arm, hand, and all five fingers pointed straight up. Make sure to keep your wrist straight during the exercise. Gently bend your fingers at the big knuckle, where your fingers meet your hand, as far down as you can while keeping the small knuckles in your fingers straight. Think of forming a tabletop with your fingers. Hold this position for __________ seconds. Extend your fingers back to the starting position, stretching every joint fully. Repeat this exercise 5-10 times with each hand. Finger spread  Place your hand flat on a table with your palm facing down. Make sure your wrist stays straight as you do this exercise. Spread your fingers and thumb apart from each other as far as you can until you feel a gentle stretch. Hold this position for __________ seconds. Bring your fingers and thumb tight together again. Hold  this position for __________ seconds. Repeat this exercise 5-10 times with each hand. Making circles  Stand or sit with your arm, hand, and all five fingers pointed straight up. Make sure to keep your wrist straight during the exercise. Make a circle by touching the tip of your thumb to the tip of your index finger. Hold for __________ seconds. Then open your hand wide. Repeat this motion with your thumb and each finger on your hand. Repeat this exercise 5-10 times with each hand. Thumb motion  Sit with your forearm resting on a table and your wrist straight. Your thumb should be facing up toward the ceiling. Keep your fingers relaxed as you move your thumb. Lift  your thumb up as high as you can toward the ceiling. Hold for __________ seconds. Bend your thumb across your palm as far as you can, reaching the tip of your thumb for the small finger (pinkie) side of your palm. Hold for __________ seconds. Repeat this exercise 5-10 times with each hand. Grip strengthening  Hold a stress ball or other soft ball in the middle of your hand. Slowly increase the pressure, squeezing the ball as much as you can without causing pain. Think of bringing the tips of your fingers into the middle of your palm. All of your finger joints should bend when doing this exercise. Hold your squeeze for __________ seconds, then relax. Repeat this exercise 5-10 times with each hand. Contact a health care provider if: Your hand pain or discomfort gets much worse when you do an exercise. Your hand pain or discomfort does not improve within 2 hours after you exercise. If you have any of these problems, stop doing these exercises right away. Do not do them again unless your health care provider says that you can. Get help right away if: You develop sudden, severe hand pain or swelling. If this happens, stop doing these exercises right away. Do not do them again unless your health care provider says that you can. This information is not intended to replace advice given to you by your health care provider. Make sure you discuss any questions you have with your health care provider. Document Revised: 10/13/2020 Document Reviewed: 10/13/2020 Elsevier Patient Education  2022 [employer]

## 2021-08-02 ENCOUNTER — Encounter: Payer: Self-pay | Admitting: Rheumatology

## 2021-08-02 LAB — CHOLESTEROL, TOTAL: Cholesterol: 191 mg/dL (ref <200)

## 2021-08-02 NOTE — Progress Notes (Signed)
CBC is normal, hemoglobin A1c is normal, CMP shows mildly elevated glucose probably not a fasting sample.  Lipid panel is pending.

## 2021-08-03 ENCOUNTER — Encounter: Payer: Self-pay | Admitting: Family

## 2021-08-03 LAB — COMPLETE METABOLIC PANEL WITH GFR
AG Ratio: 1.4 (calc) (ref 1.0–2.5)
ALT: 20 U/L (ref 6–29)
AST: 25 U/L (ref 10–35)
Albumin: 3.9 g/dL (ref 3.6–5.1)
Alkaline phosphatase (APISO): 62 U/L (ref 37–153)
BUN: 14 mg/dL (ref 7–25)
CO2: 35 mmol/L — ABNORMAL HIGH (ref 20–32)
Calcium: 9.4 mg/dL (ref 8.6–10.4)
Chloride: 102 mmol/L (ref 98–110)
Creat: 0.72 mg/dL (ref 0.50–1.03)
Globulin: 2.7 g/dL (calc) (ref 1.9–3.7)
Glucose, Bld: 139 mg/dL — ABNORMAL HIGH (ref 65–99)
Potassium: 4.7 mmol/L (ref 3.5–5.3)
Sodium: 140 mmol/L (ref 135–146)
Total Bilirubin: 0.5 mg/dL (ref 0.2–1.2)
Total Protein: 6.6 g/dL (ref 6.1–8.1)
eGFR: 97 mL/min/{1.73_m2} (ref >=60)

## 2021-08-03 LAB — HEMOGLOBIN A1C
Hgb A1c MFr Bld: 5.4 % of total Hgb (ref <5.7)
Mean Plasma Glucose: 108 mg/dL
eAG (mmol/L): 6 mmol/L

## 2021-08-03 LAB — LIPID PANEL
Cholesterol: 191 mg/dL (ref <200)
HDL: 77 mg/dL (ref >=50)
LDL Cholesterol (Calc): 96 mg/dL (calc)
Non-HDL Cholesterol (Calc): 114 mg/dL (calc) (ref <130)
Total CHOL/HDL Ratio: 2.5 (calc) (ref <5.0)
Triglycerides: 85 mg/dL (ref <150)

## 2021-08-03 LAB — CBC WITH DIFFERENTIAL/PLATELET
Absolute Monocytes: 453 cells/uL (ref 200–950)
Basophils Absolute: 50 cells/uL (ref 0–200)
Basophils Relative: 0.8 %
Eosinophils Absolute: 211 cells/uL (ref 15–500)
Eosinophils Relative: 3.4 %
HCT: 43.4 % (ref 35.0–45.0)
Hemoglobin: 14.1 g/dL (ref 11.7–15.5)
Lymphs Abs: 2685 cells/uL (ref 850–3900)
MCH: 29.4 pg (ref 27.0–33.0)
MCHC: 32.5 g/dL (ref 32.0–36.0)
MCV: 90.6 fL (ref 80.0–100.0)
MPV: 10.9 fL (ref 7.5–12.5)
Monocytes Relative: 7.3 %
Neutro Abs: 2802 cells/uL (ref 1500–7800)
Neutrophils Relative %: 45.2 %
Platelets: 239 10*3/uL (ref 140–400)
RBC: 4.79 10*6/uL (ref 3.80–5.10)
RDW: 11.9 % (ref 11.0–15.0)
Total Lymphocyte: 43.3 %
WBC: 6.2 10*3/uL (ref 3.8–10.8)

## 2021-08-03 LAB — TEST AUTHORIZATION

## 2021-08-09 ENCOUNTER — Encounter: Payer: Self-pay | Admitting: Family

## 2021-08-09 DIAGNOSIS — R21 Rash and other nonspecific skin eruption: Secondary | ICD-10-CM

## 2021-08-11 ENCOUNTER — Other Ambulatory Visit: Payer: Self-pay

## 2021-08-11 ENCOUNTER — Ambulatory Visit (INDEPENDENT_AMBULATORY_CARE_PROVIDER_SITE_OTHER): Payer: 59 | Admitting: Family

## 2021-08-11 ENCOUNTER — Encounter: Payer: Self-pay | Admitting: Family

## 2021-08-11 VITALS — BP 110/70 | HR 56 | Temp 97.6°F | Ht 68.0 in | Wt 206.0 lb

## 2021-08-11 DIAGNOSIS — R21 Rash and other nonspecific skin eruption: Secondary | ICD-10-CM | POA: Diagnosis not present

## 2021-08-11 MED ORDER — TRIAMCINOLONE ACETONIDE 0.1 % EX CREA: 1.0000 "application " | TOPICAL_CREAM | Freq: Two times a day (BID) | CUTANEOUS | 0 refills | Status: DC

## 2021-08-11 MED ORDER — METHYLPREDNISOLONE ACETATE 80 MG/ML IJ SUSP
80.0000 mg | Freq: Once | INTRAMUSCULAR | Status: AC
  Administered 2021-08-11: 80 mg via INTRAMUSCULAR

## 2021-08-11 NOTE — Progress Notes (Signed)
Subjective:     Patient ID: Kristina Chase, female    DOB: [birth year], 61 y.o.   MRN: [id number]  Chief Complaint  Patient presents with   Rash    Bilateral arms; starting Monday. She complains of Severe itching. She states not using any different detergents, soaps, or medications. She has used OTC Cera-V but has not subsided.    HPI: Rash: Patient complains of rash involving the bilateral arm. Rash started 4 days ago. Appearance of rash at onset: Color of lesion(s): red, patchy & pinpoint bumps. Rash has changed over time Initial distribution: left wrist.  Discomfort associated with rash: is pruritic.  Associated symptoms: none. Denies: fever and myalgia. Patient has not had previous evaluation of rash. Patient has not had previous treatment.  Patient has not had contacts with similar rash. Patient has not identified precipitant. Patient has not had new exposures (soaps, lotions, laundry detergents, foods, medications, plants, insects or animals.)   Past Medical History:  Diagnosis Date   Hypothyroid    Rheumatoid arthritis (HCC)     Past Surgical History:  Procedure Laterality Date   APPENDECTOMY     TOTAL ABDOMINAL HYSTERECTOMY      Outpatient Medications Prior to Visit  Medication Sig Dispense Refill   Adalimumab (HUMIRA PEN) 40 MG/0.4ML PNKT INJECT 40MG  SUBCUTANEOUSLY EVERY OTHER WEEK 2 each 2   estradiol (VIVELLE-DOT) 0.1 MG/24HR patch Place 1 patch onto the skin 2 (two) times a week.     Fexofenadine HCl (ALLEGRA PO) Take by mouth as needed.     levothyroxine (SYNTHROID) 125 MCG tablet Take 125 mcg by mouth daily before breakfast.     melatonin 5 MG TABS Take 5 mg by mouth at bedtime. Taking 1-2 tabs     No facility-administered medications prior to visit.    Allergies  Allergen Reactions   Grass Extracts [Gramineae Pollens] Itching        Objective:    Physical Exam Vitals and nursing note reviewed.  Constitutional:      Appearance: Normal appearance.   Cardiovascular:     Rate and Rhythm: Normal rate and regular rhythm.  Pulmonary:     Effort: Pulmonary effort is normal.     Breath sounds: Normal breath sounds.  Musculoskeletal:        General: Normal range of motion.  Skin:    General: Skin is warm and dry.     Findings: Rash (pinpoint and hive-like on bilateral arms from wrist to proximal elbow) present.  Neurological:     Mental Status: She is alert.  Psychiatric:        Mood and Affect: Mood normal.        Behavior: Behavior normal.    BP 110/70    Pulse (!) 56    Temp 97.6 F (36.4 C) (Temporal)    Ht 5\' 8"  (1.727 m)    Wt 206 lb (93.4 kg)    SpO2 98%    BMI 31.32 kg/m  Wt Readings from Last 3 Encounters:  08/11/21 206 lb (93.4 kg)  08/01/21 200 lb (90.7 kg)  05/11/21 201 lb (91.2 kg)       Assessment & Plan:   Problem List Items Addressed This Visit       Musculoskeletal and Integument   Skin rash - Primary    denies all possible precipitants. started 2 days ago on left wrist, and right arm, has moved up both arms to above elbow, very pruritic. pt has applied CereVe anti-itch cream  with some relief, taken Zyrtec bid and benadryl with some relief. Sending triamcinolone cream, steroid injection given today. Advised to wait one week on Humira injection.      Relevant Medications   triamcinolone cream (KENALOG) 0.1 %    Meds ordered this encounter  Medications   methylPREDNISolone acetate (DEPO-MEDROL) injection 80 mg   triamcinolone cream (KENALOG) 0.1 %    Sig: Apply 1 application topically 2 (two) times daily.    Dispense:  30 g    Refill:  0    Order Specific Question:   Supervising Provider    Answer:   ANDY, CAMILLE L [[id number]]

## 2021-08-11 NOTE — Patient Instructions (Signed)
It was very nice to see you today!  I have sent the steroid cream to your pharmacy, apply this to your rash twice a day. Let me know if it is not getting better by next week.     PLEASE NOTE:  If you had any lab tests please let us know if you have not heard back within a few days. You may see your results on MyChart before we have a chance to review them but we will give you a call once they are reviewed by [place]. If we ordered any referrals today, please let us know if you have not heard from their office within the next week.   Please try these tips to maintain a healthy lifestyle:  Eat most of your calories during the day when you are active. Eliminate processed foods including packaged sweets (pies, cakes, cookies), reduce intake of potatoes, white bread, white pasta, and white rice. Look for whole grain options, oat flour or almond flour.  Each meal should contain half fruits/vegetables, one quarter protein, and one quarter carbs (no bigger than a computer mouse).  Cut down on sweet beverages. This includes juice, soda, and sweet tea. Also watch fruit intake, though this is a healthier sweet option, it still contains natural sugar! Limit to 3 servings daily.  Drink at least 1 glass of water with each meal and aim for at least 8 glasses per day  Exercise at least 150 minutes every week.

## 2021-08-11 NOTE — Assessment & Plan Note (Signed)
denies all possible precipitants. started 2 days ago on left wrist, and right arm, has moved up both arms to above elbow, very pruritic. pt has applied CereVe anti-itch cream with some relief, taken Zyrtec bid and benadryl with some relief. Sending triamcinolone cream, steroid injection given today. Advised to wait one week on Humira injection.

## 2021-08-22 MED ORDER — PREDNISONE 10 MG PO TABS: ORAL_TABLET | ORAL | 0 refills | Status: DC

## 2021-08-22 MED ORDER — FAMOTIDINE 40 MG PO TABS: 40.0000 mg | ORAL_TABLET | Freq: Two times a day (BID) | ORAL | 0 refills | Status: DC

## 2021-08-22 MED ORDER — TRIAMCINOLONE ACETONIDE 0.5 % EX OINT: 1.0000 "application " | TOPICAL_OINTMENT | Freq: Two times a day (BID) | CUTANEOUS | 0 refills | Status: DC

## 2021-08-22 NOTE — Telephone Encounter (Signed)
Patient states that her rash is not getting any better patient states stephanie told her to call if it wasn't better. Patient would like to see if stephanie can send in something else for her.

## 2021-08-22 NOTE — Telephone Encounter (Signed)
Pt has not been seen in office since 04/2021. She sent a message 08/09/2021. Please schedule an appointment for the pt to come into the office for rash.   Thanks!

## 2021-08-23 ENCOUNTER — Other Ambulatory Visit: Payer: Self-pay | Admitting: Family

## 2021-08-23 DIAGNOSIS — R21 Rash and other nonspecific skin eruption: Secondary | ICD-10-CM

## 2021-08-24 ENCOUNTER — Telehealth: Payer: Self-pay

## 2021-08-24 NOTE — Telephone Encounter (Signed)
Received notification from [facility] regarding a prior authorization for HUMIRA. Authorization has been APPROVED from 08/24/2021 to 08/24/2022. Approval letter sent to scan center. Updated [employer] with current Prior Authorization information.  Authorization # [id number]

## 2021-08-24 NOTE — Telephone Encounter (Signed)
Per [employer], current Prior Authorization is expiring.  Submitted a Prior Authorization request to [facility] for HUMIRA via CoverMyMeds. Will update once we receive a response.   Key: [id number]

## 2021-08-25 ENCOUNTER — Encounter: Payer: Self-pay | Admitting: Rheumatology

## 2021-09-18 ENCOUNTER — Telehealth: Payer: Self-pay | Admitting: *Deleted

## 2021-09-18 NOTE — Telephone Encounter (Signed)
FYI - Patient wanted to let Dr. Corliss Skains know she has a rash and eczema and is holding her Humira. ?

## 2021-09-19 ENCOUNTER — Encounter: Payer: Self-pay | Admitting: Rheumatology

## 2021-09-19 NOTE — Telephone Encounter (Signed)
Patient scheduled for 09/21/2021 at 2:45 pm.  ?

## 2021-09-19 NOTE — Telephone Encounter (Signed)
I returned patient's call.  She states that when she stopped Humira the rash resolved.  She has been on Humira since 2019.  We discussed switching to Enbrel.  She was in agreement.  We will apply for Enbrel.  She will come in the office on Thursday at 2:45 PM to discuss medication.  Please schedule an appointment on March 16 at 2:45 PM.

## 2021-09-20 ENCOUNTER — Telehealth: Payer: Self-pay | Admitting: Pharmacist

## 2021-09-20 ENCOUNTER — Other Ambulatory Visit: Payer: Self-pay | Admitting: Family

## 2021-09-20 DIAGNOSIS — R21 Rash and other nonspecific skin eruption: Secondary | ICD-10-CM

## 2021-09-20 MED ORDER — HYDROXYZINE HCL 10 MG PO TABS: 10.0000 mg | ORAL_TABLET | Freq: Four times a day (QID) | ORAL | 0 refills | Status: DC | PRN

## 2021-09-20 NOTE — Progress Notes (Signed)
? ?Office Visit Note ? ?Patient: Kristina Chase             ?Date of Birth: 06/27/1961           ?MRN: [id number]             ?PCP: Dulce SellarHudnell, Stephanie, NP ?Referring: Dulce SellarHudnell, Stephanie, NP ?Visit Date: 09/21/2021 ?Occupation: @GUAROCC @ ? ?Subjective:  ?Medication management, rash ? ?History of Present Illness: Kristina NakayamaYvonne Dahm is a 61 y.o. female with history of seronegative rheumatoid arthritis and osteoarthritis.  She has been on Humira since 2019.  She states about 6 weeks ago she started having rash on her forearms and her shins with pruritus.  She was seen by her PCP and was given triamcinolone ointment and H2 blocker without much relief.  She was also given a steroid shot and of Medrol Dosepak.  She was seen by the dermatologist.  After the prednisone taper rash cleared up.  When she took her next Humira injection the rash came back on her extremities.  She never had rash on her trunk..  It is very pruritic.  She has been taking Benadryl and Zyrtec without much relief.  She has been given a prescription for hydroxyzine by her PCP which she will pick up today.  She has been having some stiffness in her hands since she stopped Humira.  Her last Humira injection was on September 09, 2021. ? ?Activities of Daily Living:  ?Patient reports morning stiffness for 0  none .   ?Patient Denies nocturnal pain.  ?Difficulty dressing/grooming: Denies ?Difficulty climbing stairs: Reports ?Difficulty getting out of chair: Denies ?Difficulty using hands for taps, buttons, cutlery, and/or writing: Denies ? ?Review of Systems  ?Constitutional:  Positive for fatigue.  ?HENT:  Negative for mouth dryness.   ?Eyes:  Negative for dryness.  ?Respiratory:  Negative for shortness of breath.   ?Cardiovascular:  Negative for swelling in legs/feet.  ?Gastrointestinal:  Negative for constipation.  ?Endocrine: Negative for excessive thirst.  ?Genitourinary:  Negative for difficulty urinating.  ?Musculoskeletal:  Positive for muscle weakness.  ?Skin:   Positive for rash and redness. Negative for nodules/bumps.  ?Allergic/Immunologic: Negative for susceptible to infections.  ?Neurological:  Negative for numbness.  ?Hematological:  Positive for bruising/bleeding tendency.  ?Psychiatric/Behavioral:  Positive for sleep disturbance.   ? ?PMFS History:  ?Patient Active Problem List  ? Diagnosis Date Noted  ? Skin rash 08/11/2021  ? Primary osteoarthritis of both hands 05/01/2021  ? Primary osteoarthritis of both feet 05/01/2021  ? LBBB (left bundle branch block) 05/01/2021  ? Hilar lymphadenopathy 05/01/2021  ? Hormone replacement therapy (postmenopausal) 04/21/2021  ? Adjustment insomnia 04/21/2021  ? Seronegative rheumatoid arthritis (HCC) 04/21/2021  ? Need for vaccination 04/21/2021  ? Autoimmune thyroiditis 04/07/2021  ? Primary osteoarthritis of both knees 04/07/2021  ? Achilles tendinitis 10/17/2017  ? Plantar fasciitis 10/17/2017  ?  ?Past Medical History:  ?Diagnosis Date  ? Hypothyroid   ? Rheumatoid arthritis (HCC)   ?  ?Family History  ?Problem Relation Age of Onset  ? Cancer Mother   ?     Breast  ? Atrial fibrillation Mother   ? Hypertension Mother   ? Hypothyroidism Mother   ? ?Past Surgical History:  ?Procedure Laterality Date  ? APPENDECTOMY    ? TOTAL ABDOMINAL HYSTERECTOMY    ? ?Social History  ? ?Social History Narrative  ? Not on file  ? ?Immunization History  ?Administered Date(s) Administered  ? Influenza,inj,Quad PF,6+ Mos 04/21/2021  ? PFIZER(Purple Top)SARS-COV-2 Vaccination  11/09/2019, 11/30/2019, 06/15/2020, 12/07/2020, 04/24/2021  ? [employer], [employer] 91yrs & up 04/24/2021  ? Zoster Recombinat (Shingrix) 07/09/2017  ?  ? ?Objective: ?Vital Signs: BP 115/72 (BP Location: Left Arm, Patient Position: Sitting, Cuff Size: Normal)   Pulse 67   Resp 15   Ht 5\' 8"  (1.727 m)   Wt 207 lb (93.9 kg)   BMI 31.47 kg/m?   ? ?Physical Exam ?Vitals and nursing note reviewed.  ?Constitutional:   ?   Appearance: She is  well-developed.  ?HENT:  ?   Head: Normocephalic and atraumatic.  ?Eyes:  ?   Conjunctiva/sclera: Conjunctivae normal.  ?Cardiovascular:  ?   Rate and Rhythm: Normal rate and regular rhythm.  ?   Heart sounds: Normal heart sounds.  ?Pulmonary:  ?   Effort: Pulmonary effort is normal.  ?   Breath sounds: Normal breath sounds.  ?Abdominal:  ?   General: Bowel sounds are normal.  ?   Palpations: Abdomen is soft.  ?Musculoskeletal:  ?   Cervical back: Normal range of motion.  ?Lymphadenopathy:  ?   Cervical: No cervical adenopathy.  ?Skin: ?   General: Skin is warm and dry.  ?   Capillary Refill: Capillary refill takes less than 2 seconds.  ?   Comments: Erythematous maculopapular rash was noted on bilateral lower extremities below the knees and also on her forearms.  Few lesions had honey crusting consistent with impetigo.  ?Neurological:  ?   Mental Status: She is alert and oriented to person, place, and time.  ?Psychiatric:     ?   Behavior: Behavior normal.  ?  ? ?Musculoskeletal Exam: C-spine was in good range of motion.  Shoulder joints, elbow joints, wrist joints, MCPs PIPs and DIPs and good range of motion with no synovitis.  Hip joints, knee joints, ankles, MTPs and PIPs with good range of motion with no synovitis. ? ?CDAI Exam: ?CDAI Score: 0  ?Patient Global: 0 mm; Provider Global: 0 mm ?Swollen: 0 ; Tender: 0  ?Joint Exam 09/21/2021  ? ?No joint exam has been documented for this visit  ? ?There is currently no information documented on the homunculus. Go to the Rheumatology activity and complete the homunculus joint exam. ? ?Investigation: ?No additional findings. ? ?Imaging: ?No results found. ? ?Recent Labs: ?Lab Results  ?Component Value Date  ? WBC 6.2 08/01/2021  ? HGB 14.1 08/01/2021  ? PLT 239 08/01/2021  ? NA 140 08/01/2021  ? K 4.7 08/01/2021  ? CL 102 08/01/2021  ? CO2 35 (H) 08/01/2021  ? GLUCOSE 139 (H) 08/01/2021  ? BUN 14 08/01/2021  ? CREATININE 0.72 08/01/2021  ? BILITOT 0.5 08/01/2021  ? AST  25 08/01/2021  ? ALT 20 08/01/2021  ? PROT 6.6 08/01/2021  ? CALCIUM 9.4 08/01/2021  ? QFTBGOLDPLUS NEGATIVE 04/07/2021  ? ? ?Speciality Comments: FU q 3 mths. ?dxd2019 [facility]. TTd with SSZ, Humira 2019 ? ?Procedures:  ?No procedures performed ?Allergies: Grass extracts [gramineae pollens]  ? ?Assessment / Plan:     ?Visit Diagnoses: Seronegative rheumatoid arthritis (HCC) - Dx at [facility].  F/u at [facility]. d/d spondyloarthropathy.H/o plantar fasciitis, achilles tendinitis.Ttd with SSZ and later Humira.  She has been on Humira since 2019.  Her last Humira injection was 10 days ago.  She states she started developing rash on her bilateral forearms and below her knees 6 weeks ago.  The rash has been very pruritic.  She has tried excluding all the  topical agents without much relief.  She was given topical steroids, intramuscular steroids and oral steroid Dosepak with clearance of rash.  She states the rash recurred after she took the next injection of Humira.  The rash has been only limited to her extremities and never came on her trunk or face.  Her arthritis has been well controlled.  She has no synovitis.  I am uncertain if the rashes are related to Humira as it is limited to her forearms and her lower extremities below her knees only.  She had some impetigo lesions from scratching.  Patient states that she has had staph infection in the past.  She is concerned that Humira is causing the rash.  We discussed switching from Humira to Enbrel.  I will hold off starting on Enbrel until impetigo lesions are completely resolved.  I will call in a prescription for Keflex.  Indications, side effects and contraindications were reviewed at length.  Handout was given.  Will obtain consent before starting Enbrel. ? ?Medication counseling:   ?TB Test: April 07, 2021 ?Hepatitis panel: April 07, 2021 ?HIV: April 07, 2021 ?SPEP: April 07, 2021 ?Immunoglobulins: April 07, 2021 ? ?Chest x-ray: April 20, 2021 ? ?Does patient have diagnosis of heart failure?  No ? ?Counseled patient that Enbrel is a TNF blocking agent.  Reviewed Enbrel dose of 50 mg once weekly.  Counseled patient on purpose, pro

## 2021-09-20 NOTE — Telephone Encounter (Signed)
Patient has OV with Dr. Corliss Skains tomorrow, 09/21/21 and will be switching to Enbrel from Humira. ? ?She has tried SSZ with inadequate response and Humira (was clinically stable for long time and suddenly developed rash. Rash subsided when patient held Humira, therefore will switch). ? ?Consent will be obtained at OV tomorrow but Dr. Corliss Skains ok w proceeding with BIV today ? ?Chesley Mires, PharmD, MPH, BCPS ?Clinical Pharmacist (Rheumatology and Pulmonology) ?

## 2021-09-21 ENCOUNTER — Encounter: Payer: Self-pay | Admitting: Rheumatology

## 2021-09-21 ENCOUNTER — Other Ambulatory Visit: Payer: Self-pay

## 2021-09-21 ENCOUNTER — Ambulatory Visit (INDEPENDENT_AMBULATORY_CARE_PROVIDER_SITE_OTHER): Payer: 59 | Admitting: Rheumatology

## 2021-09-21 VITALS — BP 115/72 | HR 67 | Resp 15 | Ht 68.0 in | Wt 207.0 lb

## 2021-09-21 DIAGNOSIS — I447 Left bundle-branch block, unspecified: Secondary | ICD-10-CM

## 2021-09-21 DIAGNOSIS — R21 Rash and other nonspecific skin eruption: Secondary | ICD-10-CM

## 2021-09-21 DIAGNOSIS — R590 Localized enlarged lymph nodes: Secondary | ICD-10-CM

## 2021-09-21 DIAGNOSIS — Z79899 Other long term (current) drug therapy: Secondary | ICD-10-CM

## 2021-09-21 DIAGNOSIS — M19042 Primary osteoarthritis, left hand: Secondary | ICD-10-CM

## 2021-09-21 DIAGNOSIS — M0600 Rheumatoid arthritis without rheumatoid factor, unspecified site: Secondary | ICD-10-CM

## 2021-09-21 DIAGNOSIS — L0100 Impetigo, unspecified: Secondary | ICD-10-CM

## 2021-09-21 DIAGNOSIS — M19041 Primary osteoarthritis, right hand: Secondary | ICD-10-CM

## 2021-09-21 DIAGNOSIS — M722 Plantar fascial fibromatosis: Secondary | ICD-10-CM

## 2021-09-21 DIAGNOSIS — M19071 Primary osteoarthritis, right ankle and foot: Secondary | ICD-10-CM

## 2021-09-21 DIAGNOSIS — E063 Autoimmune thyroiditis: Secondary | ICD-10-CM

## 2021-09-21 DIAGNOSIS — M170 Bilateral primary osteoarthritis of knee: Secondary | ICD-10-CM

## 2021-09-21 DIAGNOSIS — M19072 Primary osteoarthritis, left ankle and foot: Secondary | ICD-10-CM

## 2021-09-21 MED ORDER — CEPHALEXIN 500 MG PO CAPS: 500.0000 mg | ORAL_CAPSULE | Freq: Two times a day (BID) | ORAL | 0 refills | Status: DC

## 2021-09-21 NOTE — Telephone Encounter (Signed)
-----   Message from Henriette Combs, LPN sent at 01/31/3663  4:03 PM EDT ----- ?Patient was in the office today and given information for Enbrel. Please obtain consent at new start visit. Thanks!  ? ?

## 2021-09-21 NOTE — Patient Instructions (Addendum)
Standing Labs ?We placed an order today for your standing lab work.  ? ?Please have your standing labs drawn in 1 month after starting Enbrel and then every 3 months. ? ?If possible, please have your labs drawn 2 weeks prior to your appointment so that the provider can discuss your results at your appointment. ? ?Please note that you may see your imaging and lab results in MyChart before we have reviewed them. ?We may be awaiting multiple results to interpret others before contacting you. ?Please allow our office up to 72 hours to thoroughly review all of the results before contacting the office for clarification of your results. ? ?We have open lab daily: ?Monday through Thursday from 1:30-4:30 PM and Friday from 1:30-4:00 PM ?at the office of Dr. Pollyann Savoy, [facility].   ?Please be advised, all patients with office appointments requiring lab work will take precedent over walk-in lab work.  ?If possible, please come for your lab work on Monday and Friday afternoons, as you may experience shorter wait times. ?The office is located at [address], [place], [place] [address] ?No appointment is necessary.   ?Labs are drawn by [facility]. Please bring your co-pay at the time of your lab draw.  You may receive a bill from [facility] for your lab work. ? ?Please note if you are on Hydroxychloroquine and and an order has been placed for a Hydroxychloroquine level, you will need to have it drawn 4 hours or more after your last dose. ? ?If you wish to have your labs drawn at another location, please call the office 24 hours in advance to send orders. ? ?If you have any questions regarding directions or hours of operation,  ?please call [phone].   ?As a reminder, please drink plenty of water prior to coming for your lab work. Thanks!  ? ? ? ? ?Etanercept Injection ?What is this medication? ?ETANERCEPT (et a NER sept) treats autoimmune conditions, such as psoriasis and certain types of arthritis.  It works by slowing down an overactive immune system. It belongs to a group of medications called TNF inhibitors. ?This medicine may be used for other purposes; ask your health care provider or pharmacist if you have questions. ?COMMON BRAND NAME(S): Enbrel ?What should I tell my care team before I take this medication? ?They need to know if you have any of these conditions: ?Bleeding disorder ?Cancer ?Diabetes ?Granulomatosis with polyangiitis ?Heart failure ?HIV or AIDS ?Immune system problems ?Infection such as tuberculosis (TB) or other bacterial, fungal or viral infections ?Liver disease ?Nervous system problems such as Guillain-Barre syndrome, multiple sclerosis or seizures ?Recent or upcoming vaccine ?An unusual or allergic reaction to etanercept, other medications, latex, rubber, food, dyes, or preservatives ?Pregnant or trying to get pregnant ?Breast-feeding ?How should I use this medication? ?The medication is injected under the skin. You will be taught how to prepare and give it. Take it as directed on the prescription label. Keep taking it unless your care team tells you stop. ?This medication comes with INSTRUCTIONS FOR USE. Ask your pharmacist for directions on how to use this medication. Read the information carefully. Talk to your pharmacist or care team if you have questions. ?If you use a pen, be sure to take off the outer needle cover before using the dose. ?It is important that you put your used needles and syringes in a special sharps container. Do not put them in a trash can. If you do not have a sharps container,  call your pharmacist or care team to get one. ?A special MedGuide will be given to you by the pharmacist with each prescription and refill. Be sure to read this information carefully each time. ?Talk to your care team about the use of this medication in children. While it may be prescribed for children as young as 422 years of age for selected conditions, precautions do  apply. ?Overdosage: If you think you have taken too much of this medicine contact a poison control center or emergency room at once. ?NOTE: This medicine is only for you. Do not share this medicine with others. ?What if I miss a dose? ?If you miss a dose, take it as soon as you can. If it is almost time for your next dose, take only that dose. Do not take double or extra doses. ?What may interact with this medication? ?Do not take this medication with any of the following: ?Biologic medications such as adalimumab, certolizumab, golimumab, infliximab ?Live vaccines ?Rilonacept ?This medication may also interact with the following: ?Abatacept ?Anakinra ?Biologic medications such as anifrolumab, baricitinib, belimumab, canakinumab, natalizumab, rituximab, sarilumab, tocilizumab, tofacitinib, upadacitinib, vedolizumab ?Cyclophosphamide ?Sulfasalazine ?This list may not describe all possible interactions. Give your health care provider a list of all the medicines, herbs, non-prescription drugs, or dietary supplements you use. Also tell them if you smoke, drink alcohol, or use illegal drugs. Some items may interact with your medicine. ?What should I watch for while using this medication? ?Visit your care team for regular checks on your progress. Tell your care team if your symptoms do not start to get better or if they get worse. ?This medication may increase your risk of getting an infection. Call your care team for advice if you get a fever, chills, sore throat, or other symptoms of a cold or flu. Do not treat yourself. Try to avoid being around people who are sick. If you have not had the measles or chickenpox vaccines, tell your care team right away if you are around someone with these viruses. ?You will be tested for tuberculosis (TB) before you start this medication. If your care team prescribes any medication for TB, you should start taking the TB medication before starting this medication. Make sure to finish the  full course of TB medication. ?Avoid taking medications that contain aspirin, acetaminophen, ibuprofen, naproxen, or ketoprofen unless instructed by your care team. These medications may hide fever. ?Talk to your care team about your risk of cancer. You may be more at risk for certain types of cancer if you take this medication. ?This medication can decrease the response to a vaccine. If you need to get vaccinated, tell your care team if you have received this medication. Extra booster doses may be needed. Talk to your care team to see if a different vaccination schedule is needed. ?What side effects may I notice from receiving this medication? ?Side effects that you should report to your care team as soon as possible: ?Allergic reactions--skin rash, itching, hives, swelling of the face, lips, tongue, or throat ?Body pain, tingling, or numbness ?Eye pain, change in vision, vision loss ?Heart failure--shortness of breath, swelling of the ankles, feet, or hands, sudden weight gain, unusual weakness or fatigue ?Infection--fever, chills, cough, sore throat, wounds that don't heal, pain or trouble when passing urine, general feeling of discomfort or being unwell ?Liver injury--right upper belly pain, loss of appetite, nausea, light-colored stool, dark yellow or brown urine, yellowing skin or eyes, unusual weakness or fatigue ?Low red blood  cell level--unusual weakness or fatigue, dizziness, headache, trouble breathing ?Lupus-like syndrome--joint pain, swelling, or stiffness, butterfly-shaped rash on the face, rashes that get worse in the sun, fever, unusual weakness or fatigue ?New or worsening psoriasis--rash with itchy, scaly patches ?Seizures ?Unusual bruising or bleeding ?Weakness in arms and legs ?Side effects that usually do not require medical attention (report to your care team if they continue or are bothersome): ?Headache ?Pain, redness, or irritation at injection site ?Sinus pain or pressure around the face or  forehead ?This list may not describe all possible side effects. Call your doctor for medical advice about side effects. You may report side effects to FDA at 1-800-FDA-1088. ?Where should I keep my medication? ?Keep

## 2021-09-22 ENCOUNTER — Other Ambulatory Visit (HOSPITAL_COMMUNITY): Payer: Self-pay

## 2021-09-22 NOTE — Telephone Encounter (Signed)
Received notification from [facility] regarding a prior authorization for ENBREL. Authorization has been APPROVED from 09/22/21 to 09/23/22.  ? ?Patient must fill through [facility]: [phone]  ? ?Authorization #  [id number] ? ?Last dose of Humira was 09/09/21 so can start Enbrel on or after 09/23/21. Patient will have to sign up for Enbrel savings card to have it mailed to her home.  ? ?Chesley Mires, PharmD, MPH, BCPS ?Clinical Pharmacist (Rheumatology and Pulmonology) ? ?

## 2021-09-22 NOTE — Telephone Encounter (Signed)
Submitted a Prior Authorization request to [facility] for ENBREL via CoverMyMeds. Will update once we receive a response. ? ?Key:  [id number] ? ?Knox Saliva, PharmD, MPH, BCPS ?Clinical Pharmacist (Rheumatology and Pulmonology) ?

## 2021-09-25 NOTE — Telephone Encounter (Signed)
I called patient to notify about Enbrel approval. She confirmed her address and email address  - we reviewed that savings card is a debit card that she'd receive in 2-3 business days at home. ? ?She is currently taking antibiotics and will be unable to start until she has completed course. ? ?Kristina Chase, PharmD, MPH, BCPS ?Clinical Pharmacist (Rheumatology and Pulmonology) ?

## 2021-09-26 ENCOUNTER — Other Ambulatory Visit: Payer: Self-pay | Admitting: Family

## 2021-09-26 ENCOUNTER — Telehealth: Payer: Self-pay | Admitting: Family

## 2021-09-26 DIAGNOSIS — R21 Rash and other nonspecific skin eruption: Secondary | ICD-10-CM

## 2021-09-26 NOTE — Telephone Encounter (Signed)
Patient confirmed that she has received Enbrel copay card/savings card ? ?New start pending abx completion and rash resolution. Patient to reach out ? ?Chesley Mires, PharmD, MPH, BCPS ?Clinical Pharmacist (Rheumatology and Pulmonology) ?

## 2021-09-26 NOTE — Telephone Encounter (Signed)
Patient is requesting a refill for hydrOXYzine (ATARAX) 10 MG tablet Patient stated she is taking 2 pills every 6-8 hrs - patient stated she is willing to see you for another refill. Stated she is going out of town next week.  ?

## 2021-09-26 NOTE — Telephone Encounter (Signed)
yes, can fill 60 pills, 1 refill, thanks

## 2021-09-27 ENCOUNTER — Telehealth: Payer: Self-pay

## 2021-09-27 ENCOUNTER — Other Ambulatory Visit: Payer: Self-pay

## 2021-09-27 DIAGNOSIS — R21 Rash and other nonspecific skin eruption: Secondary | ICD-10-CM

## 2021-09-27 MED ORDER — HYDROXYZINE HCL 10 MG PO TABS: 10.0000 mg | ORAL_TABLET | Freq: Four times a day (QID) | ORAL | 1 refills | Status: DC | PRN

## 2021-09-27 NOTE — Telephone Encounter (Signed)
I called patient in regards to pharmacy to send her medication into. Pt states disregard her last e-mail.  ?

## 2021-10-02 NOTE — Telephone Encounter (Signed)
Patient currently out of town. She will be reach out after 10/10/21 to schedule Enbrel new start visit. States rash has cleared up significantly. ? ?Chesley Mires, PharmD, MPH, BCPS ?Clinical Pharmacist (Rheumatology and Pulmonology) ?

## 2021-10-11 NOTE — Telephone Encounter (Signed)
Patient states rash has cleared up. She went to [place] for trip and rash was basically gone. When she came back from [place], she developed itchiness again and reports that she believes it is due to the pollen. She states she has always been allergic to pollen. She completed her abx course on 10/01/21 and is no longer on any antibiotics. ? ?She is scheduled for Enbrel new start on 10/16/21 ? ?Chesley Mires, PharmD, MPH, BCPS ?Clinical Pharmacist (Rheumatology and Pulmonology) ?

## 2021-10-16 ENCOUNTER — Ambulatory Visit (INDEPENDENT_AMBULATORY_CARE_PROVIDER_SITE_OTHER): Payer: 59 | Admitting: Physician Assistant

## 2021-10-16 ENCOUNTER — Ambulatory Visit (INDEPENDENT_AMBULATORY_CARE_PROVIDER_SITE_OTHER): Payer: 59 | Admitting: Pharmacist

## 2021-10-16 ENCOUNTER — Encounter: Payer: Self-pay | Admitting: Physician Assistant

## 2021-10-16 VITALS — BP 110/70 | HR 72 | Temp 98.3°F | Ht 68.0 in | Wt 209.0 lb

## 2021-10-16 DIAGNOSIS — Z7189 Other specified counseling: Secondary | ICD-10-CM

## 2021-10-16 DIAGNOSIS — R21 Rash and other nonspecific skin eruption: Secondary | ICD-10-CM

## 2021-10-16 DIAGNOSIS — M0600 Rheumatoid arthritis without rheumatoid factor, unspecified site: Secondary | ICD-10-CM

## 2021-10-16 DIAGNOSIS — Z79899 Other long term (current) drug therapy: Secondary | ICD-10-CM

## 2021-10-16 MED ORDER — PREDNISONE 10 MG PO TABS: ORAL_TABLET | ORAL | 0 refills | Status: DC

## 2021-10-16 MED ORDER — ENBREL MINI 50 MG/ML ~~LOC~~ SOCT: 50.0000 mg | SUBCUTANEOUS | 0 refills | Status: DC

## 2021-10-16 NOTE — Patient Instructions (Addendum)
Your next ENBREL dose is due on 10/23/21, 10/30/21, and every 7 days thereafter ? ?HOLD ENBREL if you have signs or symptoms of an infection. You can resume once you feel better or back to your baseline. ?HOLD ENBREL if you start antibiotics to treat an infection. ?HOLD ENBREL around the time of surgery/procedures. Your surgeon will be able to provide recommendations on when to hold BEFORE and when you are cleared to RESUME. ? ?Pharmacy information: ?Your prescription will be shipped from [facility]. ?Their phone number is [phone] ?Please call to schedule shipment and confirm address.  Provide them with your Enbrel savings card information. They will mail your medication to your home. ? ?Labs are due in 1 month then every 3 months. ?Lab hours are from Monday to Thursday 1:30-4:30pm and Friday 1:30-4pm. You do not need an appointment if you come for labs during these times. ? ?How to manage an injection site reaction: ?Remember the 5 C's: ?COUNTER - leave on the counter at least 30 minutes but up to overnight to bring medication to room temperature. This may help prevent stinging ?COLD - place something cold (like an ice gel pack or cold water bottle) on the injection site just before cleansing with alcohol. This may help reduce pain ?CLARITIN - use Claritin (generic name is loratadine) for the first two weeks of treatment or the day of, the day before, and the day after injecting. This will help to minimize injection site reactions ?CORTISONE CREAM - apply if injection site is irritated and itching ?CALL ME - if injection site reaction is bigger than the size of your fist, looks infected, blisters, or if you develop hives ?

## 2021-10-16 NOTE — Progress Notes (Signed)
Kristina Chase is a 61 y.o. female here for a new problem. ? ?History of Present Illness:  ? ?Chief Complaint  ?Patient presents with  ? Rash  ?  Pt persistent rash all over body. She was treated with steroids was almost clear but not back.Pt is having itching.  ? ?Rash ?On 08/11/21, Kristina Chase visited Dulce Sellar, PCP due to experiencing an itchy rash on her both arms that had been onset for 4 days at that time. Appearance of the rash at this point was red, patchy, and pinpoint bumps. In an effort to manage the rash, she has tried taking zyrtec 10 mg twice daily, OTC benadryl, and applying OTC cera-V but this provided minor relief. During this initial visit she denied changes to detergents, soaps, or medications. Following this visit, pt was administered a depo medrol 80 mg injection and prescribed triamcinolone cream 0.1% to apply 1-2 times daily.  ? ?Although she experienced temporary relief, her rash returned just a week later and began spreading to her lower body. On 08/21/21, pt reached out to [facility] via MyChart for any additional treatment. As a result, pt was prescribed zyretec 10 mg twice daily, pepcid 40 mg twice daily, a prednisone 10 mg taper, and triamcinolone 0.5% ointment. She was also provided a referral to dermatology for further evaluation. On 08/29/21, pt was able to visit Dr. Laural Benes, dermatology and after further evaluation she was dx with allergic contact dermatitis. As a result she was instructed to hold pepcid 40 mg BID, continue ointment, and return for patch testing if no improvement.  ? ?Since her visit with dermatology, she had completed research on her medications and found that humira could cause rashes. Due to this she decided to hold off on the humira and touch base with Dr. Corliss Skains, rheumatology about medication alternatives. On 09/19/21, pt visited rheumatology for further discussion of this and evaluation of her rash. At that time she found the rash resolved on her  arms with the use of the ointment she was prescribed but it had remained on her legs. Upon further evaluation pt was believed by Dr. Corliss Skains to have a staph infection and was prescribed keflex 500 mg twice daily as well as switched to Enbrel 50 mg weekly injection in replacement of humira.  ? ?As of today, pt is still dealing with her rash which is now all over her body and is wondering what the next steps would be. She has trialed multiple medications and has recently been taking hydroxyzine 10 mg for the itchiness. States that she has tried not using anything that could be causing this but has had no relief. According to pt, her rash would almost completely resolve before coming back full force. She does report she is going to start the enbrel 50 mg weekly injection today. ? ?Past Medical History:  ?Diagnosis Date  ? Hypothyroid   ? Rheumatoid arthritis (HCC)   ? ?  ?Social History  ? ?Tobacco Use  ? Smoking status: Never  ? Smokeless tobacco: Never  ?Vaping Use  ? Vaping Use: Never used  ?Substance Use Topics  ? Alcohol use: Yes  ?  Alcohol/week: 7.0 standard drinks  ?  Types: 7 Glasses of wine per week  ? Drug use: Never  ? ? ?Past Surgical History:  ?Procedure Laterality Date  ? APPENDECTOMY    ? TOTAL ABDOMINAL HYSTERECTOMY    ? ? ?Family History  ?Problem Relation Age of Onset  ? Cancer Mother   ?  Breast  ? Atrial fibrillation Mother   ? Hypertension Mother   ? Hypothyroidism Mother   ? ? ?Allergies  ?Allergen Reactions  ? Grass Extracts [Gramineae Pollens] Itching  ? ? ?Current Medications:  ? ?Current Outpatient Medications:  ?  Cetirizine HCl (ZYRTEC PO), Take by mouth., Disp: , Rfl:  ?  estradiol (VIVELLE-DOT) 0.1 MG/24HR patch, Place 1 patch onto the skin 2 (two) times a week., Disp: , Rfl:  ?  Etanercept (ENBREL MINI) 50 MG/ML SOCT, Inject 50 mg into the skin once a week., Disp: 12 mL, Rfl: 0 ?  hydrOXYzine (ATARAX) 10 MG tablet, Take 1-2 tablets (10-20 mg total) by mouth every 6 (six) hours as  needed for itching., Disp: 60 tablet, Rfl: 1 ?  levothyroxine (SYNTHROID) 125 MCG tablet, Take 125 mcg by mouth daily before breakfast., Disp: , Rfl:   ? ?Review of Systems:  ? ?Review of Systems  ?Skin:  Positive for rash.  ?Negative unless otherwise specified per HPI. ?Vitals:  ? ?Vitals:  ? 10/16/21 1023  ?BP: 110/70  ?Pulse: 72  ?Temp: 98.3 ?F (36.8 ?C)  ?TempSrc: Temporal  ?Weight: 209 lb (94.8 kg)  ?Height: 5\' 8"  (1.727 m)  ?   ?Body mass index is 31.78 kg/m?. ? ?Physical Exam:  ? ?Physical Exam ?Constitutional:   ?   Appearance: Normal appearance. She is well-developed.  ?HENT:  ?   Head: Normocephalic and atraumatic.  ?Eyes:  ?   General: Lids are normal.  ?   Extraocular Movements: Extraocular movements intact.  ?   Conjunctiva/sclera: Conjunctivae normal.  ?Pulmonary:  ?   Effort: Pulmonary effort is normal.  ?Musculoskeletal:     ?   General: Normal range of motion.  ?   Cervical back: Normal range of motion and neck supple.  ?Skin: ?   General: Skin is warm and dry.  ?   Comments: Scattered erythematous lesions on upper arms, torso. No surrounding erythema or discharge from lesions.  ?Neurological:  ?   Mental Status: She is alert and oriented to person, place, and time.  ?Psychiatric:     ?   Attention and Perception: Attention and perception normal.     ?   Mood and Affect: Mood normal.     ?   Behavior: Behavior normal.     ?   Thought Content: Thought content normal.     ?   Judgment: Judgment normal.  ? ? ?Assessment and Plan:  ? ?Rash ?No red flags  ?Due to ongoing issues will have her follow back up with her dermatologist as they recommended for patch testing ?Start prednisone 20 mg daily x 1 week, then 10 mg daily x 1 week  ?May use hydroxyzine 10 mg as needed for itching  ? ?I,Havlyn C Ratchford,acting as a scribe for , PA.,have documented all relevant documentation on the behalf of [employer], PA,as directed by  Jarold Motto, PA while in the presence of Jarold Motto,  Jarold Motto. ? ?[place], PA, have reviewed all documentation for this visit. The documentation on 10/16/21 for the exam, diagnosis, procedures, and orders are all accurate and complete. ? ? ?12/16/21, PA-C ? ?

## 2021-10-16 NOTE — Progress Notes (Signed)
Pharmacy Note ? ?Subjective:   ?Patient presents to clinic today to receive first dose of Enbrel for seronegative RA.  Her last dose of Humira was on 09/09/21. She finished her antibiotic course on 10/01/21.  Reports rash has resolved. ? ?Patient running a fever or have signs/symptoms of infection? No ? ?Patient currently on antibiotics for the treatment of infection? No ? ?Patient have any upcoming invasive procedures/surgeries? No ? ?Objective: ?CMP  ?   ?Component Value Date/Time  ? NA 140 08/01/2021 1346  ? K 4.7 08/01/2021 1346  ? CL 102 08/01/2021 1346  ? CO2 35 (H) 08/01/2021 1346  ? GLUCOSE 139 (H) 08/01/2021 1346  ? BUN 14 08/01/2021 1346  ? CREATININE 0.72 08/01/2021 1346  ? CALCIUM 9.4 08/01/2021 1346  ? PROT 6.6 08/01/2021 1346  ? AST 25 08/01/2021 1346  ? ALT 20 08/01/2021 1346  ? BILITOT 0.5 08/01/2021 1346  ? ? ?CBC ?   ?Component Value Date/Time  ? WBC 6.2 08/01/2021 1346  ? RBC 4.79 08/01/2021 1346  ? HGB 14.1 08/01/2021 1346  ? HCT 43.4 08/01/2021 1346  ? PLT 239 08/01/2021 1346  ? MCV 90.6 08/01/2021 1346  ? MCH 29.4 08/01/2021 1346  ? MCHC 32.5 08/01/2021 1346  ? RDW 11.9 08/01/2021 1346  ? [facility] 2,685 08/01/2021 1346  ? EOSABS 211 08/01/2021 1346  ? BASOSABS 50 08/01/2021 1346  ? ? ?Baseline Immunosuppressant Therapy Labs ?TB GOLD ? ?  Latest Ref Rng & Units 04/07/2021  ?  8:42 AM  ?Quantiferon TB Gold  ?Quantiferon TB Gold Plus NEGATIVE NEGATIVE    ? ?Hepatitis Panel ? ?  Latest Ref Rng & Units 04/07/2021  ?  8:42 AM  ?Hepatitis  ?Hep B Surface Ag NON-REACTIVE NON-REACTIVE    ?Hep B IgM NON-REACTIVE NON-REACTIVE    ?Hep C Ab NON-REACTIVE NON-REACTIVE    ? ?HIV ?Lab Results  ?Component Value Date  ? HIV NON-REACTIVE 04/07/2021  ? ?Immunoglobulins ? ?  Latest Ref Rng & Units 04/07/2021  ?  8:42 AM  ?Immunoglobulin Electrophoresis  ?IgA  47 - 310 mg/dL 425    ?IgG 600 - 1,640 mg/dL 1,149    ?IgM 50 - 300 mg/dL 112    ? ?SPEP ? ?  Latest Ref Rng & Units 08/01/2021  ?  1:46 PM  ?Serum Protein  Electrophoresis  ?Total Protein 6.1 - 8.1 g/dL 6.6    ? ? ?Chest x-ray: 04/20/21 - No active cardiopulmonary disease ? ?Assessment/Plan:  ?Demonstrated proper injection technique with Enbrel Mini/autoinjector demo device  Patient able to demonstrate proper injection technique using the teach back method.  Patient self injected in the right abdomen with: ? ?Sample Medication: Enbrel Mini cartridge ?[place]: [id number] ?Lot: [id number] ?Expiration: 05/2023 ? ?Sample Medication: Enbrel Autotouch reusable autoinjector ?[place]: [id number] ?Lot: [id number] ?Expiration: 06/07/2024 ? ?Patient tolerated well.  Observed for 30 mins in office for adverse reaction and none noted.  ? ?Patient is to return in 1 month for labs and 6-8 weeks for follow-up appointment.  Standing orders placed.  ? ?Enbrel approved through insurance .   Rx sent to: [facility]: [phone] .  Patient provided with pharmacy phone number and advised to call later this week to schedule shipment to home. ? ?She will continue Enbrel 50mg  SQ every 7 days. ? ?Reviewed importance of yearly skin checks while taking TNF inhibitor due to risk for non-melanoma skin cancer. ? ?All questions encouraged and answered.  Instructed patient to call with any further questions or  concerns. ? ?Knox Saliva, PharmD, MPH, BCPS ?Clinical Pharmacist (Rheumatology and Pulmonology) ? ?10/16/2021 8:33 AM ?

## 2021-10-16 NOTE — Patient Instructions (Signed)
It was great to see you! ? ?Start daily prednisone 20 mg daily x 1 week, then 10 mg daily 1 week. ? ?Take care, ? ?Jarold Motto PA-C  ?

## 2021-10-18 ENCOUNTER — Encounter: Payer: Self-pay | Admitting: Physician Assistant

## 2021-10-20 ENCOUNTER — Encounter: Payer: Self-pay | Admitting: Physician Assistant

## 2021-10-20 DIAGNOSIS — L209 Atopic dermatitis, unspecified: Secondary | ICD-10-CM | POA: Insufficient documentation

## 2021-10-25 ENCOUNTER — Other Ambulatory Visit: Payer: Self-pay | Admitting: Family

## 2021-10-25 DIAGNOSIS — R21 Rash and other nonspecific skin eruption: Secondary | ICD-10-CM

## 2021-11-02 ENCOUNTER — Ambulatory Visit: Payer: 59 | Admitting: Physician Assistant

## 2021-11-14 NOTE — Progress Notes (Unsigned)
Office Visit Note  Patient: Kristina Chase             Date of Birth: October 09, 1960           MRN: [id number]             PCP: Dulce Sellar, NP Referring: Dulce Sellar, NP Visit Date: 11/28/2021 Occupation: @GUAROCC @  Subjective:  Medication monitoring  History of Present Illness: Kristina Chase is a 61 y.o. female with history of seronegative rheumatoid arthritis and osteoarthritis.  She is on Enbrel 50 mg subcutaneous injections once weekly.  She initiated Enbrel on 10/16/2021.  She has been tolerating Enbrel without any side effects or injection site reactions.  The rash on her lower extremities has been healing.  She completed the course of Keflex as advised.  She established care with an allergist and was started on Xyzal as well as Flonase. She denies any infections since starting enbrel.   She denies any increased joint pain or joint swelling at this time.      Activities of Daily Living:  Patient reports morning stiffness for 0  none .   Patient Denies nocturnal pain.  Difficulty dressing/grooming: Denies Difficulty climbing stairs: Reports Difficulty getting out of chair: Denies Difficulty using hands for taps, buttons, cutlery, and/or writing: Reports  Review of Systems  Constitutional:  Positive for fatigue.  HENT:  Positive for mouth dryness. Negative for mouth sores and nose dryness.   Eyes:  Negative for pain, visual disturbance and dryness.  Respiratory:  Negative for cough, hemoptysis, shortness of breath and difficulty breathing.   Cardiovascular:  Negative for chest pain, palpitations, hypertension and swelling in legs/feet.  Gastrointestinal:  Negative for blood in stool, constipation and diarrhea.  Endocrine: Negative for excessive thirst and increased urination.  Genitourinary:  Negative for difficulty urinating and painful urination.  Musculoskeletal:  Positive for joint pain and joint pain. Negative for joint swelling, myalgias, muscle weakness,  morning stiffness, muscle tenderness and myalgias.  Skin:  Negative for color change, pallor, rash, hair loss, nodules/bumps, skin tightness, ulcers and sensitivity to sunlight.  Allergic/Immunologic: Negative for susceptible to infections.  Neurological:  Positive for weakness. Negative for dizziness, numbness and headaches.  Hematological:  Positive for bruising/bleeding tendency. Negative for swollen glands.  Psychiatric/Behavioral:  Positive for sleep disturbance. Negative for depressed mood. The patient is not nervous/anxious.    PMFS History:  Patient Active Problem List   Diagnosis Date Noted   Atopic dermatitis 10/20/2021   Skin rash 08/11/2021   Primary osteoarthritis of both hands 05/01/2021   Primary osteoarthritis of both feet 05/01/2021   LBBB (left bundle branch block) 05/01/2021   Hilar lymphadenopathy 05/01/2021   Hormone replacement therapy (postmenopausal) 04/21/2021   Adjustment insomnia 04/21/2021   Seronegative rheumatoid arthritis (HCC) 04/21/2021   Need for vaccination 04/21/2021   Autoimmune thyroiditis 04/07/2021   Primary osteoarthritis of both knees 04/07/2021   Achilles tendinitis 10/17/2017   Plantar fasciitis 10/17/2017    Past Medical History:  Diagnosis Date   Hypothyroid    Rheumatoid arthritis (HCC)     Family History  Problem Relation Age of Onset   Cancer Mother        Breast   Atrial fibrillation Mother    Hypertension Mother    Hypothyroidism Mother    Past Surgical History:  Procedure Laterality Date   APPENDECTOMY     TOTAL ABDOMINAL HYSTERECTOMY     Social History   Social History Narrative   Not on file  Immunization History  Administered Date(s) Administered   Influenza,inj,Quad PF,6+ Mos 04/21/2021   PFIZER(Purple Top)SARS-COV-2 Vaccination 11/09/2019, 11/30/2019, 06/15/2020, 12/07/2020, 04/24/2021   Pfizer Covid-19 Vaccine Bivalent Booster 9747yrs & up 04/24/2021   Zoster Recombinat (Shingrix) 07/09/2017      Objective: Vital Signs: BP 113/72 (BP Location: Left Arm, Patient Position: Sitting, Cuff Size: Normal)   Pulse 70   Resp 15   Ht 5\' 9"  (1.753 m)   Wt 208 lb (94.3 kg)   BMI 30.72 kg/m    Physical Exam Vitals and nursing note reviewed.  Constitutional:      Appearance: She is well-developed.  HENT:     Head: Normocephalic and atraumatic.  Eyes:     Conjunctiva/sclera: Conjunctivae normal.  Cardiovascular:     Rate and Rhythm: Normal rate and regular rhythm.     Heart sounds: Normal heart sounds.  Pulmonary:     Effort: Pulmonary effort is normal.     Breath sounds: Normal breath sounds.  Abdominal:     General: Bowel sounds are normal.     Palpations: Abdomen is soft.  Musculoskeletal:     Cervical back: Normal range of motion.  Skin:    General: Skin is warm and dry.     Capillary Refill: Capillary refill takes less than 2 seconds.     Comments: Healing erythematous lesions on bilateral LE.   Neurological:     Mental Status: She is alert and oriented to person, place, and time.  Psychiatric:        Behavior: Behavior normal.     Musculoskeletal Exam: C-spine, thoracic spine, lumbar spine have good range of motion.  No midline spinal tenderness or SI joint tenderness.  Shoulder joints, elbow joints, wrist joints, MCPs, PIPs, DIPs have good range of motion with no synovitis.  Complete fist formation bilaterally.  Hip joints have good range of motion with no groin pain.  Knee joints have good range of motion with no warmth or effusion.  Ankle joints have good range of motion with no tenderness or joint swelling.  CDAI Exam: CDAI Score: 0.4  Patient Global: 2 mm; Provider Global: 2 mm Swollen: 0 ; Tender: 0  Joint Exam 11/28/2021   No joint exam has been documented for this visit   There is currently no information documented on the homunculus. Go to the Rheumatology activity and complete the homunculus joint exam.  Investigation: No additional  findings.  Imaging: No results found.  Recent Labs: Lab Results  Component Value Date   WBC 6.2 08/01/2021   HGB 14.1 08/01/2021   PLT 239 08/01/2021   NA 140 08/01/2021   K 4.7 08/01/2021   CL 102 08/01/2021   CO2 35 (H) 08/01/2021   GLUCOSE 139 (H) 08/01/2021   BUN 14 08/01/2021   CREATININE 0.72 08/01/2021   BILITOT 0.5 08/01/2021   AST 25 08/01/2021   ALT 20 08/01/2021   PROT 6.6 08/01/2021   CALCIUM 9.4 08/01/2021   QFTBGOLDPLUS NEGATIVE 04/07/2021    Speciality Comments: FU q 3 mths. dxd2019 [facility]. TTd with SSZ, Humira 2019   Patient will need a consent on Enbrel before starting Enbrel.  Procedures:  No procedures performed Allergies: Grass extracts [gramineae pollens]   Assessment / Plan:     Visit Diagnoses: Seronegative rheumatoid arthritis (HCC) - [facility]  [facility] d/d spondyloarthropathy.H/o plantar fasciitis, achilles tendinitis.Ttd with SSZ and later Humira: She has no synovitis or joint tenderness on examination today.  She was started on Enbrel 50 mg weekly injections on 10/16/2021.  She has been tolerating Enbrel without any side effects or injection site reactions.  She has not noticed any new or worsening symptoms since switching from Humira to Enbrel.  She has not been experiencing any morning stiffness or nocturnal pain.  She continues to have chronic pain in both knee joints due to underlying osteoarthritis.  On examination today she has good range of motion of both knee joints with no warmth or effusion.  She will remain on Enbrel as prescribed.  She was advised to notify us if she develops signs or symptoms of a flare.  She will follow-up in the office in 3 months or sooner if needed.  High risk medication use - Enbrel 50 mg subcutaneous once weekly.  CBC and CMP updated on 11/23/2021.  Her next lab work will be due in August and every 3 months to monitor for drug toxicity.  Standing orders for CBC and CMP  remain in place. TB Gold negative on 04/07/2021.  Future order for TB gold will be placed today.- Plan: QuantiFERON-TB Gold Plus Discussed the importance of holding Enbrel if she develops signs or symptoms of an infection and to resume once infection is completely cleared.   Screening for tuberculosis -Future order for TB gold placed today plan: QuantiFERON-TB Gold Plus  Rash: Healing erythematous rash on bilateral LE.   Primary osteoarthritis of both hands: Clinical and radiographic findings are consistent with osteoarthritis.  No inflammation was noted on examination.   Primary osteoarthritis of both knees: She has chronic pain in both knees due to underlying osteoarthritis.  She has good ROM of both knee joints.  No warmth or effusion noted. She plans on trying to lose 20-30 pounds.   Primary osteoarthritis of both feet: She has good ROM of both ankle joints.  No tenderness or joint swelling at this time.   Other medical conditions are listed as follows:  Plantar fasciitis, bilateral - No recurrence.   LBBB (left bundle branch block)  Autoimmune thyroiditis  Hilar lymphadenopathy - calcified lymph nodes were noted on the chest x-ray.    Orders: Orders Placed This Encounter  Procedures   QuantiFERON-TB Gold Plus   No orders of the defined types were placed in this encounter.    Follow-Up Instructions: Return in about 3 months (around 02/28/2022) for Rheumatoid arthritis, Osteoarthritis.   Gearldine Bienenstock, PA-C  Note - This record has been created using Dragon software.  Chart creation errors have been sought, but may not always  have been located. Such creation errors do not reflect on  the standard of medical care.

## 2021-11-23 DIAGNOSIS — E785 Hyperlipidemia, unspecified: Secondary | ICD-10-CM | POA: Insufficient documentation

## 2021-11-23 DIAGNOSIS — Z6830 Body mass index (BMI) 30.0-30.9, adult: Secondary | ICD-10-CM | POA: Insufficient documentation

## 2021-11-24 LAB — HM MAMMOGRAPHY

## 2021-11-28 ENCOUNTER — Encounter: Payer: Self-pay | Admitting: Physician Assistant

## 2021-11-28 ENCOUNTER — Ambulatory Visit (INDEPENDENT_AMBULATORY_CARE_PROVIDER_SITE_OTHER): Payer: 59 | Admitting: Physician Assistant

## 2021-11-28 VITALS — BP 113/72 | HR 70 | Resp 15 | Ht 69.0 in | Wt 208.0 lb

## 2021-11-28 DIAGNOSIS — L0100 Impetigo, unspecified: Secondary | ICD-10-CM | POA: Diagnosis not present

## 2021-11-28 DIAGNOSIS — R21 Rash and other nonspecific skin eruption: Secondary | ICD-10-CM | POA: Diagnosis not present

## 2021-11-28 DIAGNOSIS — M722 Plantar fascial fibromatosis: Secondary | ICD-10-CM

## 2021-11-28 DIAGNOSIS — M170 Bilateral primary osteoarthritis of knee: Secondary | ICD-10-CM

## 2021-11-28 DIAGNOSIS — Z79899 Other long term (current) drug therapy: Secondary | ICD-10-CM

## 2021-11-28 DIAGNOSIS — M19071 Primary osteoarthritis, right ankle and foot: Secondary | ICD-10-CM

## 2021-11-28 DIAGNOSIS — M19041 Primary osteoarthritis, right hand: Secondary | ICD-10-CM

## 2021-11-28 DIAGNOSIS — E063 Autoimmune thyroiditis: Secondary | ICD-10-CM

## 2021-11-28 DIAGNOSIS — I447 Left bundle-branch block, unspecified: Secondary | ICD-10-CM

## 2021-11-28 DIAGNOSIS — M0600 Rheumatoid arthritis without rheumatoid factor, unspecified site: Secondary | ICD-10-CM

## 2021-11-28 DIAGNOSIS — M19042 Primary osteoarthritis, left hand: Secondary | ICD-10-CM

## 2021-11-28 DIAGNOSIS — M19072 Primary osteoarthritis, left ankle and foot: Secondary | ICD-10-CM

## 2021-11-28 DIAGNOSIS — Z111 Encounter for screening for respiratory tuberculosis: Secondary | ICD-10-CM

## 2021-11-28 DIAGNOSIS — R590 Localized enlarged lymph nodes: Secondary | ICD-10-CM

## 2021-11-28 NOTE — Patient Instructions (Signed)
Standing Labs ?We placed an order today for your standing lab work.  ? ?Please have your standing labs drawn in August and every 3 months  ? ?If possible, please have your labs drawn 2 weeks prior to your appointment so that the provider can discuss your results at your appointment. ? ?Please note that you may see your imaging and lab results in MyChart before we have reviewed them. ?We may be awaiting multiple results to interpret others before contacting you. ?Please allow our office up to 72 hours to thoroughly review all of the results before contacting the office for clarification of your results. ? ?We have open lab daily: ?Monday through Thursday from 1:30-4:30 PM and Friday from 1:30-4:00 PM ?at the office of Dr. Shaili Deveshwar, [facility].   ?Please be advised, all patients with office appointments requiring lab work will take precedent over walk-in lab work.  ?If possible, please come for your lab work on Monday and Friday afternoons, as you may experience shorter wait times. ?The office is located at [address], [address], [place], [place] [address] ?No appointment is necessary.   ?Labs are drawn by [facility]. Please bring your co-pay at the time of your lab draw.  You may receive a bill from [facility] for your lab work. ? ?Please note if you are on Hydroxychloroquine and and an order has been placed for a Hydroxychloroquine level, you will need to have it drawn 4 hours or more after your last dose. ? ?If you wish to have your labs drawn at another location, please call the office 24 hours in advance to send orders. ? ?If you have any questions regarding directions or hours of operation,  ?please call [phone].   ?As a reminder, please drink plenty of water prior to coming for your lab work. Thanks! ? ?

## 2021-12-01 ENCOUNTER — Encounter: Payer: Self-pay | Admitting: Family

## 2021-12-26 ENCOUNTER — Other Ambulatory Visit: Payer: Self-pay | Admitting: Rheumatology

## 2021-12-26 DIAGNOSIS — Z79899 Other long term (current) drug therapy: Secondary | ICD-10-CM

## 2021-12-26 DIAGNOSIS — M0600 Rheumatoid arthritis without rheumatoid factor, unspecified site: Secondary | ICD-10-CM

## 2021-12-26 NOTE — Telephone Encounter (Signed)
Next Visit: 03/01/2022  Last Visit: 11/28/2021  Last Fill: 10/16/2021  GN:[id number] rheumatoid arthritis   Current Dose per office note 11/28/2021: Enbrel 50 mg subcutaneous once weekly  Labs: 11/23/2021 MCHC 32.7, rest on CBC WNL, CMP WNL  TB Gold: 04/07/2021 Neg    Okay to refill Enbrel?

## 2022-01-01 ENCOUNTER — Other Ambulatory Visit: Payer: Self-pay

## 2022-01-01 ENCOUNTER — Encounter: Payer: Self-pay | Admitting: Family

## 2022-01-01 MED ORDER — ESTRADIOL 0.1 MG/24HR TD PTTW: 1.0000 | MEDICATED_PATCH | TRANSDERMAL | 0 refills | Status: DC

## 2022-01-21 ENCOUNTER — Other Ambulatory Visit: Payer: Self-pay | Admitting: Family

## 2022-01-22 ENCOUNTER — Encounter: Payer: Self-pay | Admitting: Family

## 2022-01-22 DIAGNOSIS — Z9289 Personal history of other medical treatment: Secondary | ICD-10-CM

## 2022-01-31 ENCOUNTER — Encounter: Payer: Self-pay | Admitting: Family

## 2022-02-03 ENCOUNTER — Encounter: Payer: Self-pay | Admitting: Rheumatology

## 2022-02-05 NOTE — Telephone Encounter (Signed)
We will discuss treatment options at her upcoming follow up visit.

## 2022-02-06 NOTE — Addendum Note (Signed)
Addended by: Candie Chroman on: 02/06/2022 02:55 PM   Modules accepted: Orders

## 2022-02-14 ENCOUNTER — Encounter: Payer: Self-pay | Admitting: Family

## 2022-02-15 ENCOUNTER — Other Ambulatory Visit: Payer: Self-pay | Admitting: Family

## 2022-02-15 MED ORDER — ESTRADIOL 0.1 MG/24HR TD PTTW: 1.0000 | MEDICATED_PATCH | TRANSDERMAL | 2 refills | Status: DC

## 2022-02-15 NOTE — Telephone Encounter (Signed)
Patient requesting call back to let her know if dexa was received yet at [phone].

## 2022-02-16 ENCOUNTER — Telehealth: Payer: Self-pay | Admitting: Family

## 2022-02-16 NOTE — Telephone Encounter (Signed)
Patient requests to be called at ph# [phone] to given her bone density results

## 2022-02-16 NOTE — Progress Notes (Signed)
Office Visit Note  Patient: Kristina Chase             Date of Birth: [birth year]           MRN: [id number]             PCP: Dulce Sellar, NP Referring: Dulce Sellar, NP Visit Date: 03/01/2022 Occupation: @GUAROCC @  Subjective:  Rash  History of Present Illness: [name] is a 61 y.o. female with history of seronegative rheumatoid arthritis.  She was seen last on September 21, 2021.  At that time she reported having a rash from Humira.  She stopped Humira in the first week of March 2023.  On the examination impetigo was diagnosed.  She was placed on Keflex.  She states she took Keflex and the rash eventually cleared.  She started Enbrel on October 16, 2021.  States she started developing rash on her extremities in June 2023.  She stopped Enbrel February 06, 2022.  She states the hives are better and the rash is improving.  She has not seen a dermatologist since she started Enbrel.  She is currently not having any joint pain or joint swelling.  Activities of Daily Living:  Patient reports morning stiffness for 0 minutes.   Patient Denies nocturnal pain.  Difficulty dressing/grooming: Denies Difficulty climbing stairs: Denies Difficulty getting out of chair: Denies Difficulty using hands for taps, buttons, cutlery, and/or writing: Denies  Review of Systems  Constitutional:  Positive for fatigue.  HENT:  Negative for mouth sores and mouth dryness.   Eyes:  Negative for dryness.  Respiratory:  Negative for shortness of breath.   Cardiovascular:  Negative for chest pain and palpitations.  Gastrointestinal:  Negative for blood in stool, constipation and diarrhea.  Endocrine: Negative for increased urination.  Genitourinary:  Negative for involuntary urination.  Musculoskeletal:  Negative for joint pain, gait problem, joint pain, joint swelling, myalgias, muscle weakness, morning stiffness, muscle tenderness and myalgias.  Skin:  Positive for rash. Negative for color change, hair  loss and sensitivity to sunlight.  Allergic/Immunologic: Positive for susceptible to infections.  Neurological:  Positive for headaches. Negative for dizziness.  Hematological:  Negative for swollen glands.  Psychiatric/Behavioral:  Positive for sleep disturbance. Negative for depressed mood. The patient is not nervous/anxious.     PMFS History:  Patient Active Problem List   Diagnosis Date Noted   Allergic rhinitis 02/19/2022   Allergic rhinitis due to animal (cat) (dog) hair and dander 02/19/2022   Allergic rhinitis due to pollen 02/19/2022   Osteopenia of neck of right femur 02/19/2022   BMI 30.0-30.9,adult 11/23/2021   Dyslipidemia 11/23/2021   Atopic dermatitis 10/20/2021   Skin rash 08/11/2021   Primary osteoarthritis of both hands 05/01/2021   Primary osteoarthritis of both feet 05/01/2021   LBBB (left bundle branch block) 05/01/2021   Hilar lymphadenopathy 05/01/2021   Hormone replacement therapy (postmenopausal) 04/21/2021   Adjustment insomnia 04/21/2021   Seronegative rheumatoid arthritis (HCC) 04/21/2021   Need for vaccination 04/21/2021   Autoimmune thyroiditis 04/07/2021   Primary osteoarthritis of both knees 04/07/2021   Achilles tendinitis 10/17/2017   Plantar fasciitis 10/17/2017    Past Medical History:  Diagnosis Date   Hypothyroid    Rheumatoid arthritis (HCC)     Family History  Problem Relation Age of Onset   Cancer Mother        Breast   Atrial fibrillation Mother    Hypertension Mother    Hypothyroidism Mother    Past Surgical History:  Procedure Laterality Date   APPENDECTOMY     TOTAL ABDOMINAL HYSTERECTOMY     Social History   Social History Narrative   Not on file   Immunization History  Administered Date(s) Administered   Influenza,inj,Quad PF,6+ Mos 04/21/2021   PFIZER(Purple Top)SARS-COV-2 Vaccination 11/09/2019, 11/30/2019, 06/15/2020, 12/07/2020, 04/24/2021, 11/07/2021   Pfizer Covid-19 Vaccine Bivalent Booster 63yrs & up  04/24/2021   Zoster Recombinat (Shingrix) 07/09/2017     Objective: Vital Signs: BP 114/75 (BP Location: Left Arm, Patient Position: Sitting, Cuff Size: Normal)   Pulse 65   Resp 15   Ht 5\' 8"  (1.727 m)   Wt 192 lb 9.6 oz (87.4 kg)   BMI 29.28 kg/m    Physical Exam Vitals and nursing note reviewed.  Constitutional:      Appearance: She is well-developed.  HENT:     Head: Normocephalic and atraumatic.  Eyes:     Conjunctiva/sclera: Conjunctivae normal.  Cardiovascular:     Rate and Rhythm: Normal rate and regular rhythm.     Heart sounds: Normal heart sounds.  Pulmonary:     Effort: Pulmonary effort is normal.     Breath sounds: Normal breath sounds.  Abdominal:     General: Bowel sounds are normal.     Palpations: Abdomen is soft.  Musculoskeletal:     Cervical back: Normal range of motion.     Right lower leg: Edema present.     Left lower leg: Edema present.  Lymphadenopathy:     Cervical: No cervical adenopathy.  Skin:    General: Skin is warm and dry.     Capillary Refill: Capillary refill takes less than 2 seconds.     Comments: Erythematous papular lesions were noted on the lower extremities with excoriation marks.  Neurological:     Mental Status: She is alert and oriented to person, place, and time.  Psychiatric:        Behavior: Behavior normal.      Musculoskeletal Exam: Cervical spine was in good range of motion.  Shoulder joints, elbow joints were wrist joints, MCPs PIPs and DIPs with good range of motion with no synovitis.  Hip joints, knee joints, ankles and MTPs with good range of motion with no synovitis.  CDAI Exam: CDAI Score: 0  Patient Global: 0 mm; Provider Global: 0 mm Swollen: 0 ; Tender: 0  Joint Exam 03/01/2022   No joint exam has been documented for this visit   There is currently no information documented on the homunculus. Go to the Rheumatology activity and complete the homunculus joint exam.  Investigation: No additional  findings.  Imaging: No results found.  Recent Labs: Lab Results  Component Value Date   WBC 6.2 08/01/2021   HGB 14.1 08/01/2021   PLT 239 08/01/2021   NA 140 08/01/2021   K 4.7 08/01/2021   CL 102 08/01/2021   CO2 35 (H) 08/01/2021   GLUCOSE 139 (H) 08/01/2021   BUN 14 08/01/2021   CREATININE 0.72 08/01/2021   BILITOT 0.5 08/01/2021   AST 25 08/01/2021   ALT 20 08/01/2021   PROT 6.6 08/01/2021   CALCIUM 9.4 08/01/2021   QFTBGOLDPLUS NEGATIVE 04/07/2021    Speciality Comments: FU q 3 mths. dxd2019 [facility] [facility]. TTd with SSZ, Humira 2019   Patient will need a consent on Enbrel before starting Enbrel.  Procedures:  No procedures performed Allergies: Grass extracts [gramineae pollens]   Assessment / Plan:     Visit Diagnoses: Seronegative rheumatoid arthritis (HCC) -  Dx at [facility]-2019.  F/u at [facility] rheum. d/d spondyloarthropathy.H/o plantar fasciitis, achilles tendinitis.Ttd with SSZ and later Humira: I am uncertain about the diagnosis of rheumatoid arthritis.  Patient was on Humira from 2019 till March 2023.  She discontinued Humira assuming that it caused the rash.  She was found to have impetigo with excoriation marks.  She was given a course of Keflex.  She states that Keflex resolved the rash.  She started Enbrel in April 2023.  In June 2023 the rashes started again and she stopped Enbrel in the first week of August.  She is using some topical agents.  She denies any joint pain or joint swelling.  We had a detailed discussion at the last visit that I did not see any synovitis on the examination.  Her x-rays were consistent with osteoarthritis.  Patient states that at this point she would like to stay off the Biologics and would like to observe if the swelling returns.  I was in agreement.  We will keep her off all the Biologics.  She is supposed to notify me if she develops swelling otherwise we will see her back in 5 months.  High risk medication use  -patient was on Humira from 2019 till March 2023.  Enbrel from April 2023 until August 2023.  Patient discontinued both medications due to rash most likely impetigo.  Rash - Healing erythematous rash on bilateral LE.   Impetigo-she has impetiginous lesions at the last visit and also few at that visit.  Excoriation marks were also noted.  Primary osteoarthritis of both hands-clinical and radiographic findings are consistent with osteoarthritis.  No synovitis was noted.  Primary osteoarthritis of both knees-she complains of bilateral knee joint pain.  X-rays showed mild osteoarthritis and mild chondromalacia patella.  Primary osteoarthritis of both feet-she denies any discomfort today.  She had osteoarthritis on the x-rays.  Pedal edema-she had mild bilateral pedal edema on her lower extremity and erythema.  Increased risk of infection with pedal edema was discussed.  Use of compression socks and low-salt diet was discussed.  I advised her to follow-up with her PCP.  Plantar fasciitis, bilateral - No recurrence.   LBBB (left bundle branch block)  Autoimmune thyroiditis  Hilar lymphadenopathy - calcified lymph nodes were noted on the chest x-ray.  Orders: No orders of the defined types were placed in this encounter.  No orders of the defined types were placed in this encounter.    Follow-Up Instructions: Return in about 5 months (around 08/01/2022) for Rheumatoid arthritis, Osteoarthritis.   Pollyann Savoy, MD  Note - This record has been created using Animal nutritionist.  Chart creation errors have been sought, but may not always  have been located. Such creation errors do not reflect on  the standard of medical care.

## 2022-02-16 NOTE — Telephone Encounter (Signed)
I called pt, appointment scheduled for 8/14 on Monday at 8 am, Virtual with [place].

## 2022-02-19 ENCOUNTER — Telehealth (INDEPENDENT_AMBULATORY_CARE_PROVIDER_SITE_OTHER): Payer: 59 | Admitting: Family

## 2022-02-19 ENCOUNTER — Encounter: Payer: Self-pay | Admitting: Family

## 2022-02-19 VITALS — Ht 69.0 in | Wt 190.0 lb

## 2022-02-19 DIAGNOSIS — J3081 Allergic rhinitis due to animal (cat) (dog) hair and dander: Secondary | ICD-10-CM | POA: Insufficient documentation

## 2022-02-19 DIAGNOSIS — M85851 Other specified disorders of bone density and structure, right thigh: Secondary | ICD-10-CM | POA: Diagnosis not present

## 2022-02-19 DIAGNOSIS — J309 Allergic rhinitis, unspecified: Secondary | ICD-10-CM | POA: Insufficient documentation

## 2022-02-19 DIAGNOSIS — J301 Allergic rhinitis due to pollen: Secondary | ICD-10-CM | POA: Insufficient documentation

## 2022-02-19 NOTE — Patient Instructions (Addendum)
It was very nice to see you today!   I took a picture of the Bone density report (2 pages) and I'm hoping if you click on the visit note, you will see 2 lines that say Scan and these are the pictures of the report.  If this doesn't work, let me know!

## 2022-02-19 NOTE — Telephone Encounter (Signed)
Pt had an appointment with Judeth Cornfield at 8 am today on 02/19/2022 to discuss.

## 2022-02-19 NOTE — Progress Notes (Signed)
MyChart Video Visit    Virtual Visit via Video Note   This format is felt to be most appropriate for this patient at this time. Physical exam was limited by quality of the video and audio technology used for the visit. CMA was able to get the patient set up on a video visit.  Patient location: Home. Patient and provider in visit Provider location: Office  I discussed the limitations of evaluation and management by telemedicine and the availability of in person appointments. The patient expressed understanding and agreed to proceed.  Visit Date: 02/19/2022  Today's healthcare provider: Dulce Sellar, NP     Subjective:   Patient ID: Kristina Chase, female    DOB: [birth year], 61 y.o.   MRN: [id number]  Chief Complaint  Patient presents with   Follow-up    Discuss Bone density.    HPI Bone density scan:  pt had scan performed on 02/09/2022 and would like to discuss the results today. Pt reports taking Calcium off and on due constipation, taking OTC Vitamin D3 5,000units daily and walking on a treadmill, 4-5d/week.   Assessment & Plan:   Problem List Items Addressed This Visit       Musculoskeletal and Integument   Osteopenia of neck of right femur - Primary    chronic pt had last DEXA done in 2020 and per her previousresults there has been some improvement advised to continue taking up to 600-1200mg  of Calcium unless supplemented in diet and continue Vitamin D3 qd advised to schedule visit in 2-3 mos to check Vit D level       Past Medical History:  Diagnosis Date   Hypothyroid    Rheumatoid arthritis (HCC)     Past Surgical History:  Procedure Laterality Date   APPENDECTOMY     TOTAL ABDOMINAL HYSTERECTOMY      Outpatient Medications Prior to Visit  Medication Sig Dispense Refill   Cetirizine HCl (ZYRTEC PO) Take by mouth.     cholecalciferol (VITAMIN D3) 25 MCG (1000 UNIT) tablet Take 5,000 Units by mouth daily.     estradiol (VIVELLE-DOT) 0.1  MG/24HR patch Place 1 patch (0.1 mg total) onto the skin 2 (two) times a week. 8 patch 2   fluticasone (FLONASE) 50 MCG/ACT nasal spray 1 spray in each nostril     hydrOXYzine (ATARAX) 10 MG tablet TAKE 1 TO 2 TABLETS(10 TO 20 MG) BY MOUTH EVERY 6 HOURS AS NEEDED FOR ITCHING 60 tablet 1   levocetirizine (XYZAL) 5 MG tablet Take 1 tablet by mouth daily.     levothyroxine (SYNTHROID) 125 MCG tablet Take 125 mcg by mouth daily before breakfast.     cholecalciferol (VITAMIN D3) 25 MCG (1000 UNIT) tablet Take 5,000 Units by mouth daily.     ENBREL MINI 50 MG/ML SOCT INJECT 50MG  SUBCUTANEOUSLY  WEEKLY 4 mL 2   predniSONE (DELTASONE) 10 MG tablet Take two tablets daily x 1 week, then one tablet daily x 1 week. 21 tablet 0   triamcinolone cream (KENALOG) 0.5 %      No facility-administered medications prior to visit.    Allergies  Allergen Reactions   Grass Extracts [Gramineae Pollens] Itching       Objective:   Physical Exam Vitals and nursing note reviewed.  Constitutional:      General: She is not in acute distress.    Appearance: Normal appearance.  HENT:     Head: Normocephalic.  Pulmonary:     Effort: No respiratory distress.  Musculoskeletal:     Cervical back: Normal range of motion.  Skin:    General: Skin is dry.     Coloration: Skin is not pale.  Neurological:     Mental Status: She is alert and oriented to person, place, and time.  Psychiatric:        Mood and Affect: Mood normal.   Ht 5\' 9"  (1.753 m)   Wt 190 lb (86.2 kg)   BMI 28.06 kg/m   Wt Readings from Last 3 Encounters:  02/19/22 190 lb (86.2 kg)  11/28/21 208 lb (94.3 kg)  10/16/21 209 lb (94.8 kg)      I discussed the assessment and treatment plan with the patient. The patient was provided an opportunity to ask questions and all were answered. The patient agreed with the plan and demonstrated an understanding of the instructions.   The patient was advised to call back or seek an in-person evaluation  if the symptoms worsen or if the condition fails to improve as anticipated.  12/16/21, NP [facility] [phone] (phone) [phone] (fax)  [facility]

## 2022-02-19 NOTE — Assessment & Plan Note (Addendum)
   chronic  pt had last DEXA done in 2020 and per her previousresults there has been some improvement  advised to continue taking up to 600-1200mg  of Calcium unless supplemented in diet and continue Vitamin D3 qd  advised to schedule visit in 2-3 mos to check Vit D level

## 2022-02-23 ENCOUNTER — Ambulatory Visit: Payer: 59 | Admitting: Family

## 2022-03-01 ENCOUNTER — Ambulatory Visit: Payer: 59 | Attending: Rheumatology | Admitting: Rheumatology

## 2022-03-01 ENCOUNTER — Encounter: Payer: Self-pay | Admitting: Rheumatology

## 2022-03-01 VITALS — BP 114/75 | HR 65 | Resp 15 | Ht 68.0 in | Wt 192.6 lb

## 2022-03-01 DIAGNOSIS — Z79899 Other long term (current) drug therapy: Secondary | ICD-10-CM

## 2022-03-01 DIAGNOSIS — L0100 Impetigo, unspecified: Secondary | ICD-10-CM | POA: Diagnosis not present

## 2022-03-01 DIAGNOSIS — R600 Localized edema: Secondary | ICD-10-CM

## 2022-03-01 DIAGNOSIS — R21 Rash and other nonspecific skin eruption: Secondary | ICD-10-CM | POA: Diagnosis not present

## 2022-03-01 DIAGNOSIS — M170 Bilateral primary osteoarthritis of knee: Secondary | ICD-10-CM

## 2022-03-01 DIAGNOSIS — M722 Plantar fascial fibromatosis: Secondary | ICD-10-CM

## 2022-03-01 DIAGNOSIS — M19071 Primary osteoarthritis, right ankle and foot: Secondary | ICD-10-CM

## 2022-03-01 DIAGNOSIS — I447 Left bundle-branch block, unspecified: Secondary | ICD-10-CM

## 2022-03-01 DIAGNOSIS — E063 Autoimmune thyroiditis: Secondary | ICD-10-CM

## 2022-03-01 DIAGNOSIS — M19072 Primary osteoarthritis, left ankle and foot: Secondary | ICD-10-CM

## 2022-03-01 DIAGNOSIS — M0600 Rheumatoid arthritis without rheumatoid factor, unspecified site: Secondary | ICD-10-CM

## 2022-03-01 DIAGNOSIS — M19041 Primary osteoarthritis, right hand: Secondary | ICD-10-CM

## 2022-03-01 DIAGNOSIS — R590 Localized enlarged lymph nodes: Secondary | ICD-10-CM

## 2022-03-01 DIAGNOSIS — M19042 Primary osteoarthritis, left hand: Secondary | ICD-10-CM

## 2022-03-04 NOTE — Progress Notes (Unsigned)
   Patient ID: Kristina Chase, female    DOB: 01/22/1961, 61 y.o.   MRN: [id number]  No chief complaint on file.   HPI: Hypothyroidism: Patient presents today for followup of Hypothyroidism.  Patient reports positive compliance with daily medication.  Patient denies any of the following symptoms: fatigue, cold intolerance, constipation, weight gain or inability to lose weight, muscle weakness, mental slowing, dry hair and skin. Last TSH and free T4: Lab Results  Component Value Date   TSH 1.27 04/07/2021      Assessment & Plan:   Problem List Items Addressed This Visit   None   Subjective:    Outpatient Medications Prior to Visit  Medication Sig Dispense Refill  . Cetirizine HCl (ZYRTEC PO) Take by mouth.    . cholecalciferol (VITAMIN D3) 25 MCG (1000 UNIT) tablet Take 5,000 Units by mouth daily.    Marland Kitchen estradiol (VIVELLE-DOT) 0.1 MG/24HR patch Place 1 patch (0.1 mg total) onto the skin 2 (two) times a week. 8 patch 2  . fluticasone (FLONASE) 50 MCG/ACT nasal spray 1 spray in each nostril    . hydrOXYzine (ATARAX) 10 MG tablet TAKE 1 TO 2 TABLETS(10 TO 20 MG) BY MOUTH EVERY 6 HOURS AS NEEDED FOR ITCHING 60 tablet 1  . levocetirizine (XYZAL) 5 MG tablet Take 1 tablet by mouth daily.    Marland Kitchen levothyroxine (SYNTHROID) 125 MCG tablet Take 125 mcg by mouth daily before breakfast.     No facility-administered medications prior to visit.   Past Medical History:  Diagnosis Date  . Hypothyroid   . Rheumatoid arthritis [facility])    Past Surgical History:  Procedure Laterality Date  . APPENDECTOMY    . TOTAL ABDOMINAL HYSTERECTOMY     Allergies  Allergen Reactions  . Grass Extracts [Gramineae Pollens] Itching      Objective:    Physical Exam Vitals and nursing note reviewed.  Constitutional:      Appearance: Normal appearance.  Cardiovascular:     Rate and Rhythm: Normal rate and regular rhythm.  Pulmonary:     Effort: Pulmonary effort is normal.     Breath sounds: Normal  breath sounds.  Musculoskeletal:        General: Normal range of motion.  Skin:    General: Skin is warm and dry.  Neurological:     Mental Status: She is alert.  Psychiatric:        Mood and Affect: Mood normal.        Behavior: Behavior normal.  There were no vitals taken for this visit. Wt Readings from Last 3 Encounters:  03/01/22 192 lb 9.6 oz (87.4 kg)  02/19/22 190 lb (86.2 kg)  11/28/21 208 lb (94.3 kg)       Dulce Sellar, NP

## 2022-03-05 ENCOUNTER — Encounter: Payer: Self-pay | Admitting: Rheumatology

## 2022-03-05 ENCOUNTER — Ambulatory Visit: Payer: 59 | Admitting: Family

## 2022-03-05 ENCOUNTER — Ambulatory Visit (INDEPENDENT_AMBULATORY_CARE_PROVIDER_SITE_OTHER): Payer: 59 | Admitting: Family

## 2022-03-05 VITALS — BP 90/61 | HR 67 | Temp 97.3°F | Ht 68.0 in | Wt 193.2 lb

## 2022-03-05 DIAGNOSIS — E063 Autoimmune thyroiditis: Secondary | ICD-10-CM | POA: Diagnosis not present

## 2022-03-05 DIAGNOSIS — R21 Rash and other nonspecific skin eruption: Secondary | ICD-10-CM | POA: Diagnosis not present

## 2022-03-05 DIAGNOSIS — M85851 Other specified disorders of bone density and structure, right thigh: Secondary | ICD-10-CM

## 2022-03-05 DIAGNOSIS — Z23 Encounter for immunization: Secondary | ICD-10-CM

## 2022-03-05 LAB — T4, FREE: Free T4: 1.11 ng/dL (ref 0.60–1.60)

## 2022-03-05 LAB — TSH: TSH: 0.76 u[IU]/mL (ref 0.35–5.50)

## 2022-03-05 LAB — VITAMIN D 25 HYDROXY (VIT D DEFICIENCY, FRACTURES): VITD: 85.05 ng/mL (ref 30.00–100.00)

## 2022-03-05 MED ORDER — DOXYCYCLINE HYCLATE 100 MG PO TABS: 100.0000 mg | ORAL_TABLET | Freq: Two times a day (BID) | ORAL | 0 refills | Status: DC

## 2022-03-05 NOTE — Assessment & Plan Note (Addendum)
.   chronic . pt had last DEXA done in 2020 and per her previous results there has been some improvement . advised to continue taking up to 600-1200mg  of Calcium unless supplemented in diet and continue Vitamin D3 qd . checking Vitamin D level today

## 2022-03-05 NOTE — Assessment & Plan Note (Signed)
   Chronic  taking Levothyroxine qd  sending refill  rechecking labs  f/u in 6 mos

## 2022-03-05 NOTE — Patient Instructions (Signed)
It was very nice to see you today!   I will review your lab results via MyChart in a couple of days.  I have sent over a new antibiotic, Doxycycline to your pharmacy.  Take this for 2 weeks, but if you feel the rash is mostly gone and no new lesions popping up, then ok to stop around 10 days. Be careful out in the sun, wear sunscreen & cover up. Hope this works!  Have a great week!    PLEASE NOTE:  If you had any lab tests please let us know if you have not heard back within a few days. You may see your results on MyChart before we have a chance to review them but we will give you a call once they are reviewed by [place]. If we ordered any referrals today, please let us know if you have not heard from their office within the next week.

## 2022-03-06 ENCOUNTER — Encounter: Payer: Self-pay | Admitting: Family

## 2022-03-06 NOTE — Progress Notes (Signed)
Your thyroid level and Vitamin D level are in normal range. Continue your current daily dose of Levothyroxine and over the counter Vitamin D.

## 2022-03-07 ENCOUNTER — Other Ambulatory Visit: Payer: Self-pay

## 2022-03-07 DIAGNOSIS — E063 Autoimmune thyroiditis: Secondary | ICD-10-CM

## 2022-03-07 MED ORDER — LEVOTHYROXINE SODIUM 125 MCG PO TABS: 125.0000 ug | ORAL_TABLET | Freq: Every day | ORAL | 0 refills | Status: DC

## 2022-04-03 ENCOUNTER — Other Ambulatory Visit: Payer: Self-pay | Admitting: Family

## 2022-04-03 DIAGNOSIS — R21 Rash and other nonspecific skin eruption: Secondary | ICD-10-CM

## 2022-04-18 ENCOUNTER — Other Ambulatory Visit: Payer: Self-pay | Admitting: Family

## 2022-04-24 ENCOUNTER — Ambulatory Visit: Payer: 59 | Admitting: Family

## 2022-05-13 ENCOUNTER — Other Ambulatory Visit: Payer: Self-pay | Admitting: Family

## 2022-05-13 DIAGNOSIS — E063 Autoimmune thyroiditis: Secondary | ICD-10-CM

## 2022-06-05 ENCOUNTER — Telehealth: Payer: Self-pay | Admitting: Family

## 2022-06-05 NOTE — Telephone Encounter (Signed)
Jody with [facility] states they received a RX that is unreadable (possibly Hydroxyzine) and requests a new RX be sent to e-scribe or fax to Fax# [phone]

## 2022-06-06 ENCOUNTER — Other Ambulatory Visit: Payer: Self-pay

## 2022-06-06 DIAGNOSIS — R21 Rash and other nonspecific skin eruption: Secondary | ICD-10-CM

## 2022-06-06 MED ORDER — HYDROXYZINE HCL 10 MG PO TABS: ORAL_TABLET | ORAL | 1 refills | Status: DC

## 2022-06-06 NOTE — Telephone Encounter (Signed)
RX Sent. 

## 2022-07-19 NOTE — Progress Notes (Deleted)
Office Visit Note  Patient: Kristina Chase             Date of Birth: [birth year]           MRN: [id number]             PCP: Jeanie Sewer, NP Referring: Jeanie Sewer, NP Visit Date: 08/02/2022 Occupation: '@GUAROCC'$ @  Subjective:  No chief complaint on file.   History of Present Illness: Kristina Chase is a 62 y.o. female ***     Activities of Daily Living:  Patient reports morning stiffness for *** {minute/hour:19697}.   Patient {ACTIONS;DENIES/REPORTS:21021675::"Denies"} nocturnal pain.  Difficulty dressing/grooming: {ACTIONS;DENIES/REPORTS:21021675::"Denies"} Difficulty climbing stairs: {ACTIONS;DENIES/REPORTS:21021675::"Denies"} Difficulty getting out of chair: {ACTIONS;DENIES/REPORTS:21021675::"Denies"} Difficulty using hands for taps, buttons, cutlery, and/or writing: {ACTIONS;DENIES/REPORTS:21021675::"Denies"}  No Rheumatology ROS completed.   PMFS History:  Patient Active Problem List   Diagnosis Date Noted   Allergic rhinitis 02/19/2022   Allergic rhinitis due to animal (cat) (dog) hair and dander 02/19/2022   Allergic rhinitis due to pollen 02/19/2022   Osteopenia of neck of right femur 02/19/2022   BMI 30.0-30.9,adult 11/23/2021   Dyslipidemia 11/23/2021   Atopic dermatitis 10/20/2021   Skin rash 08/11/2021   Primary osteoarthritis of both hands 05/01/2021   Primary osteoarthritis of both feet 05/01/2021   LBBB (left bundle branch block) 05/01/2021   Hilar lymphadenopathy 05/01/2021   Hormone replacement therapy (postmenopausal) 04/21/2021   Adjustment insomnia 04/21/2021   Seronegative rheumatoid arthritis ([place]) 04/21/2021   Need for vaccination 04/21/2021   Autoimmune thyroiditis 04/07/2021   Primary osteoarthritis of both knees 04/07/2021   Achilles tendinitis 10/17/2017   Plantar fasciitis 10/17/2017    Past Medical History:  Diagnosis Date   Hypothyroid    Rheumatoid arthritis ([place])     Family History  Problem Relation Age of Onset    Cancer Mother        Breast   Atrial fibrillation Mother    Hypertension Mother    Hypothyroidism Mother    Past Surgical History:  Procedure Laterality Date   APPENDECTOMY     TOTAL ABDOMINAL HYSTERECTOMY     Social History   Social History Narrative   Not on file   Immunization History  Administered Date(s) Administered   Influenza,inj,Quad PF,6+ Mos 04/21/2021, 03/05/2022   PFIZER(Purple Top)SARS-COV-2 Vaccination 11/09/2019, 11/30/2019, 06/15/2020, 12/07/2020, 04/24/2021, 11/07/2021   Pfizer Covid-19 Vaccine Bivalent Booster 1yr & up 04/24/2021   Zoster Recombinat (Shingrix) 09/09/2017, 11/15/2017     Objective: Vital Signs: There were no vitals taken for this visit.   Physical Exam   Musculoskeletal Exam: ***  CDAI Exam: CDAI Score: -- Patient Global: --; Provider Global: -- Swollen: --; Tender: -- Joint Exam 08/02/2022   No joint exam has been documented for this visit   There is currently no information documented on the homunculus. Go to the Rheumatology activity and complete the homunculus joint exam.  Investigation: No additional findings.  Imaging: No results found.  Recent Labs: Lab Results  Component Value Date   WBC 6.2 08/01/2021   HGB 14.1 08/01/2021   PLT 239 08/01/2021   NA 140 08/01/2021   K 4.7 08/01/2021   CL 102 08/01/2021   CO2 35 (H) 08/01/2021   GLUCOSE 139 (H) 08/01/2021   BUN 14 08/01/2021   CREATININE 0.72 08/01/2021   BILITOT 0.5 08/01/2021   AST 25 08/01/2021   ALT 20 08/01/2021   PROT 6.6 08/01/2021   CALCIUM 9.4 08/01/2021   QFTBGOLDPLUS NEGATIVE 04/07/2021    Speciality Comments: FU  q 3 mths. dxd2019 [facility] [facility]. TTd with SSZ, Humira 2019   Patient will need a consent on Enbrel before starting Enbrel.  Procedures:  No procedures performed Allergies: Grass extracts [gramineae pollens]   Assessment / Plan:     Visit Diagnoses: No diagnosis found.  Orders: No orders of the defined types were  placed in this encounter.  No orders of the defined types were placed in this encounter.   Face-to-face time spent with patient was *** minutes. Greater than 50% of time was spent in counseling and coordination of care.  Follow-Up Instructions: No follow-ups on file.   Earnestine Mealing, CMA  Note - This record has been created using Editor, commissioning.  Chart creation errors have been sought, but may not always  have been located. Such creation errors do not reflect on  the standard of medical care.

## 2022-08-02 ENCOUNTER — Ambulatory Visit: Payer: 59 | Admitting: Rheumatology

## 2022-08-02 DIAGNOSIS — M722 Plantar fascial fibromatosis: Secondary | ICD-10-CM

## 2022-08-02 DIAGNOSIS — I447 Left bundle-branch block, unspecified: Secondary | ICD-10-CM

## 2022-08-02 DIAGNOSIS — R590 Localized enlarged lymph nodes: Secondary | ICD-10-CM

## 2022-08-02 DIAGNOSIS — M170 Bilateral primary osteoarthritis of knee: Secondary | ICD-10-CM

## 2022-08-02 DIAGNOSIS — M19071 Primary osteoarthritis, right ankle and foot: Secondary | ICD-10-CM

## 2022-08-02 DIAGNOSIS — R21 Rash and other nonspecific skin eruption: Secondary | ICD-10-CM

## 2022-08-02 DIAGNOSIS — R600 Localized edema: Secondary | ICD-10-CM

## 2022-08-02 DIAGNOSIS — Z79899 Other long term (current) drug therapy: Secondary | ICD-10-CM

## 2022-08-02 DIAGNOSIS — M19042 Primary osteoarthritis, left hand: Secondary | ICD-10-CM

## 2022-08-02 DIAGNOSIS — L0100 Impetigo, unspecified: Secondary | ICD-10-CM

## 2022-08-02 DIAGNOSIS — E063 Autoimmune thyroiditis: Secondary | ICD-10-CM

## 2022-08-02 DIAGNOSIS — M0600 Rheumatoid arthritis without rheumatoid factor, unspecified site: Secondary | ICD-10-CM

## 2022-09-05 ENCOUNTER — Ambulatory Visit: Payer: 59 | Admitting: Family

## 2022-09-27 ENCOUNTER — Encounter: Payer: Self-pay | Admitting: Family

## 2022-10-01 NOTE — Telephone Encounter (Signed)
please print the labs she uploaded and place on my desk, thx!

## 2022-10-02 NOTE — Telephone Encounter (Signed)
Printed

## 2022-10-16 ENCOUNTER — Encounter: Payer: Self-pay | Admitting: Family

## 2022-10-16 ENCOUNTER — Ambulatory Visit (INDEPENDENT_AMBULATORY_CARE_PROVIDER_SITE_OTHER): Payer: 59 | Admitting: Family

## 2022-10-16 VITALS — BP 119/66 | HR 70 | Temp 98.0°F | Ht 68.0 in | Wt 193.6 lb

## 2022-10-16 DIAGNOSIS — R7989 Other specified abnormal findings of blood chemistry: Secondary | ICD-10-CM

## 2022-10-16 DIAGNOSIS — Z0000 Encounter for general adult medical examination without abnormal findings: Secondary | ICD-10-CM

## 2022-10-16 DIAGNOSIS — G4725 Circadian rhythm sleep disorder, jet lag type: Secondary | ICD-10-CM

## 2022-10-16 DIAGNOSIS — Z1231 Encounter for screening mammogram for malignant neoplasm of breast: Secondary | ICD-10-CM

## 2022-10-16 DIAGNOSIS — Z23 Encounter for immunization: Secondary | ICD-10-CM

## 2022-10-16 LAB — COMPREHENSIVE METABOLIC PANEL
ALT: 24 U/L (ref 0–35)
AST: 22 U/L (ref 0–37)
Albumin: 4 g/dL (ref 3.5–5.2)
Alkaline Phosphatase: 73 U/L (ref 39–117)
BUN: 19 mg/dL (ref 6–23)
CO2: 29 mEq/L (ref 19–32)
Calcium: 9.4 mg/dL (ref 8.4–10.5)
Chloride: 102 mEq/L (ref 96–112)
Creatinine, Ser: 0.59 mg/dL (ref 0.40–1.20)
GFR: 98.16 mL/min (ref >60.00)
Glucose, Bld: 89 mg/dL (ref 70–99)
Potassium: 4.2 mEq/L (ref 3.5–5.1)
Sodium: 138 mEq/L (ref 135–145)
Total Bilirubin: 0.5 mg/dL (ref 0.2–1.2)
Total Protein: 6.7 g/dL (ref 6.0–8.3)

## 2022-10-16 LAB — T4, FREE: Free T4: 1.59 ng/dL (ref 0.60–1.60)

## 2022-10-16 LAB — LIPID PANEL
Cholesterol: 198 mg/dL (ref 0–200)
HDL: 70.8 mg/dL (ref >39.00)
LDL Cholesterol: 114 mg/dL — ABNORMAL HIGH (ref 0–99)
NonHDL: 127.22
Total CHOL/HDL Ratio: 3
Triglycerides: 68 mg/dL (ref 0.0–149.0)
VLDL: 13.6 mg/dL (ref 0.0–40.0)

## 2022-10-16 LAB — CBC WITH DIFFERENTIAL/PLATELET
Basophils Absolute: 0 10*3/uL (ref 0.0–0.1)
Basophils Relative: 0.7 % (ref 0.0–3.0)
Eosinophils Absolute: 0.2 10*3/uL (ref 0.0–0.7)
Eosinophils Relative: 2.6 % (ref 0.0–5.0)
HCT: 42.8 % (ref 36.0–46.0)
Hemoglobin: 14 g/dL (ref 12.0–15.0)
Lymphocytes Relative: 30.1 % (ref 12.0–46.0)
Lymphs Abs: 2 10*3/uL (ref 0.7–4.0)
MCHC: 32.8 g/dL (ref 30.0–36.0)
MCV: 88.5 fl (ref 78.0–100.0)
Monocytes Absolute: 0.5 10*3/uL (ref 0.1–1.0)
Monocytes Relative: 7.8 % (ref 3.0–12.0)
Neutro Abs: 3.8 10*3/uL (ref 1.4–7.7)
Neutrophils Relative %: 58.8 % (ref 43.0–77.0)
Platelets: 233 10*3/uL (ref 150.0–400.0)
RBC: 4.83 Mil/uL (ref 3.87–5.11)
RDW: 13.1 % (ref 11.5–15.5)
WBC: 6.5 10*3/uL (ref 4.0–10.5)

## 2022-10-16 LAB — TSH: TSH: 0.02 u[IU]/mL — ABNORMAL LOW (ref 0.35–5.50)

## 2022-10-16 LAB — T3, FREE: T3, Free: 3.2 pg/mL (ref 2.3–4.2)

## 2022-10-16 MED ORDER — ZOLPIDEM TARTRATE 5 MG PO TABS: 5.0000 mg | ORAL_TABLET | Freq: Every evening | ORAL | 0 refills | Status: DC | PRN

## 2022-10-16 NOTE — Telephone Encounter (Signed)
Please see patient msg and advise 

## 2022-10-16 NOTE — Progress Notes (Unsigned)
Phone [phone]  Subjective:   Patient is a 62 y.o. female presenting for annual physical.    Chief Complaint  Patient presents with   Annual Exam    Fasting w/ labs    HPI: Sleep problem:  pt reports flying frequently to [place] and she suffers from jet lag. Requesting a RX for Ambien to help with these symptoms when arrives in [place] and returns home.  See problem oriented charting- ROS- full  review of systems was completed and negative except for Sleep problem in HPI above.  The following were reviewed and entered/updated in epic: Past Medical History:  Diagnosis Date   Hypothyroid    Rheumatoid arthritis    Patient Active Problem List   Diagnosis Date Noted   Circadian rhythm sleep disorder, 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   Allergic rhinitis 02/19/2022   Allergic rhinitis due to pollen 02/19/2022   Osteopenia of neck of right femur 02/19/2022   BMI 30.0-30.9,adult 11/23/2021   Dyslipidemia 11/23/2021   Primary osteoarthritis of both hands 05/01/2021   Primary osteoarthritis of both feet 05/01/2021   LBBB (left bundle branch block) 05/01/2021   Hilar lymphadenopathy 05/01/2021   Hormone replacement therapy (postmenopausal) 04/21/2021   Need for vaccination 04/21/2021   Autoimmune thyroiditis 04/07/2021   Primary osteoarthritis of both knees 04/07/2021   Past Surgical History:  Procedure Laterality Date   APPENDECTOMY     TOTAL ABDOMINAL HYSTERECTOMY      Family History  Problem Relation Age of Onset   Cancer Mother        Breast   Atrial fibrillation Mother    Hypertension Mother    Hypothyroidism Mother     Medications- reviewed and updated Current Outpatient Medications  Medication Sig Dispense Refill   Cetirizine HCl (ZYRTEC PO) Take by mouth.     cholecalciferol (VITAMIN D3) 25 MCG (1000 UNIT) tablet Take 5,000 Units by mouth daily.     DOTTI 0.1 MG/24HR patch APPLY 1 PATCH TOPICALLY TO SKIN  TWICE WEEKLY 24 patch 3   levothyroxine (SYNTHROID) 125 MCG  tablet TAKE 1 TABLET BY MOUTH DAILY  BEFORE BREAKFAST 90 tablet 3   zolpidem (AMBIEN) 5 MG tablet Take 1 tablet (5 mg total) by mouth at bedtime as needed for sleep. 10 tablet 0   No current facility-administered medications for this visit.    Allergies-reviewed and updated Allergies  Allergen Reactions   Grass Extracts [Gramineae Pollens] Itching    Social History   Social History Narrative   Not on file    Objective:  BP 119/66   Pulse 70   Temp 98 F (36.7 C) (Temporal)   Ht 5\' 8"  (1.727 m)   Wt 193 lb 9.6 oz (87.8 kg)   SpO2 97%   BMI 29.44 kg/m  Physical Exam Vitals and nursing note reviewed.  Constitutional:      Appearance: Normal appearance.  HENT:     Head: Normocephalic.     Right Ear: Tympanic membrane normal.     Left Ear: Tympanic membrane normal.     Nose: Nose normal.     Mouth/Throat:     Mouth: Mucous membranes are moist.  Eyes:     Pupils: Pupils are equal, round, and reactive to light.  Cardiovascular:     Rate and Rhythm: Normal rate and regular rhythm.  Pulmonary:     Effort: Pulmonary effort is normal.     Breath sounds: Normal breath sounds.  Musculoskeletal:        General: Normal  range of motion.     Cervical back: Normal range of motion.  Lymphadenopathy:     Cervical: No cervical adenopathy.  Skin:    General: Skin is warm and dry.  Neurological:     Mental Status: She is alert.  Psychiatric:        Mood and Affect: Mood normal.        Behavior: Behavior normal.      Assessment and Plan   Health Maintenance counseling: 1. Anticipatory guidance: Patient counseled regarding regular dental exams q6 months, eye exams,  avoiding smoking and second hand smoke, limiting alcohol to 1 beverage per day, no illicit drugs.   2. Risk factor reduction:  Advised patient of need for regular exercise and diet rich with fruits and vegetables to reduce risk of heart attack and stroke. Exercise- most days.  Wt Readings from Last 3 Encounters:   10/16/22 193 lb 9.6 oz (87.8 kg)  03/05/22 193 lb 3.2 oz (87.6 kg)  03/01/22 192 lb 9.6 oz (87.4 kg)   3. Immunizations/screenings/ancillary studies Immunization History  Administered Date(s) Administered   Influenza,inj,Quad PF,6+ Mos 04/21/2021, 03/05/2022   PFIZER(Purple Top)SARS-COV-2 Vaccination 11/09/2019, 11/30/2019, 06/15/2020, 12/07/2020, 04/24/2021, 11/07/2021   Pfizer Covid-19 Vaccine Bivalent Booster 19yrs & up 04/24/2021   Zoster Recombinat (Shingrix) 09/09/2017, 11/15/2017   Health Maintenance Due  Topic Date Due   DTaP/Tdap/Td (1 - Tdap) Never done    4. Cervical cancer screening- hysterectomy 5. Breast cancer screening-  mammogram -11/2021 6. Colon cancer screening - 2023 7. Skin cancer screening- advised regular sunscreen use. Denies worrisome, changing, or new skin lesions.  8. Birth control/STD check- post-menopause/N/A 9. Osteoporosis screening- 02/2022 10. Alcohol screening: 1/per night 11. Smoking associated screening (lung cancer screening, AAA screen 65-75, UA)- non- smoker  Annual physical exam -     Comprehensive metabolic panel -     Lipid panel -     CBC with Differential/Platelet  Low serum thyroid stimulating hormone (TSH) -     T4, free -     TSH -     T3, free  Need for vaccination -     Tdap vaccine greater than or equal to 7yo IM  Screening mammogram for breast cancer -     MM 3D DIAGNOSTIC MAMMOGRAM BILATERAL BREAST; Future  Circadian rhythm sleep disorder, jet lag type -     Zolpidem Tartrate; Take 1 tablet (5 mg total) by mouth at bedtime as needed for sleep.  Dispense: 10 tablet; Refill: 0   Recommended follow up:  No follow-ups on file. Future Appointments  Date Time Provider Department Center  10/17/2023  9:00 AM Dulce Sellar, NP [facility]    Lab/Order associations:fasting   Dulce Sellar, NP

## 2022-10-17 DIAGNOSIS — Z23 Encounter for immunization: Secondary | ICD-10-CM | POA: Diagnosis not present

## 2022-10-18 ENCOUNTER — Encounter: Payer: Self-pay | Admitting: Family

## 2022-11-09 IMAGING — DX DG CHEST 2V
2 series · 2 of 2 positions shown · non-contrast
Comparison: None.

CLINICAL DATA: Immunosuppressive therapy

EXAM:
CHEST - 2 VIEW

[chest pa]
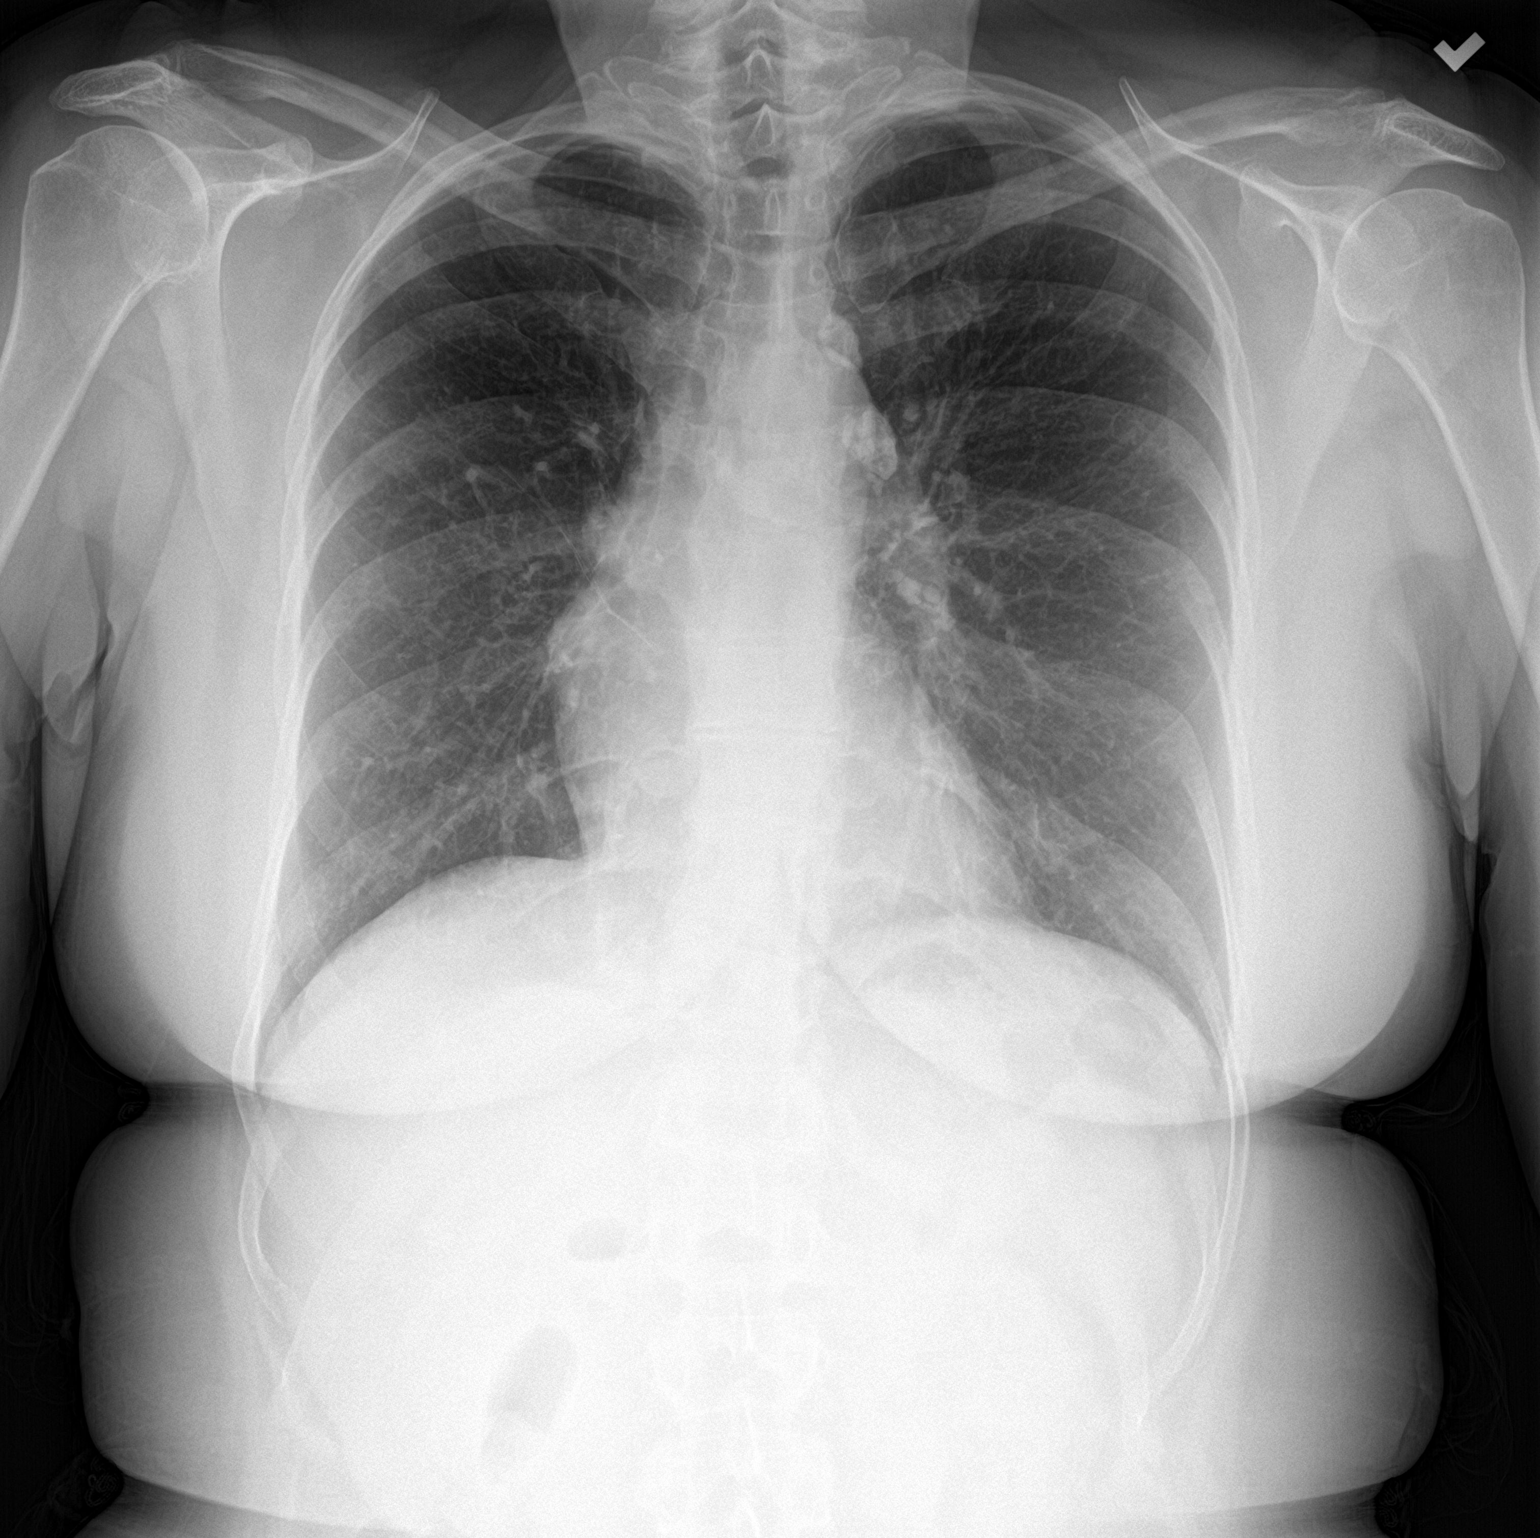

[chest lat]
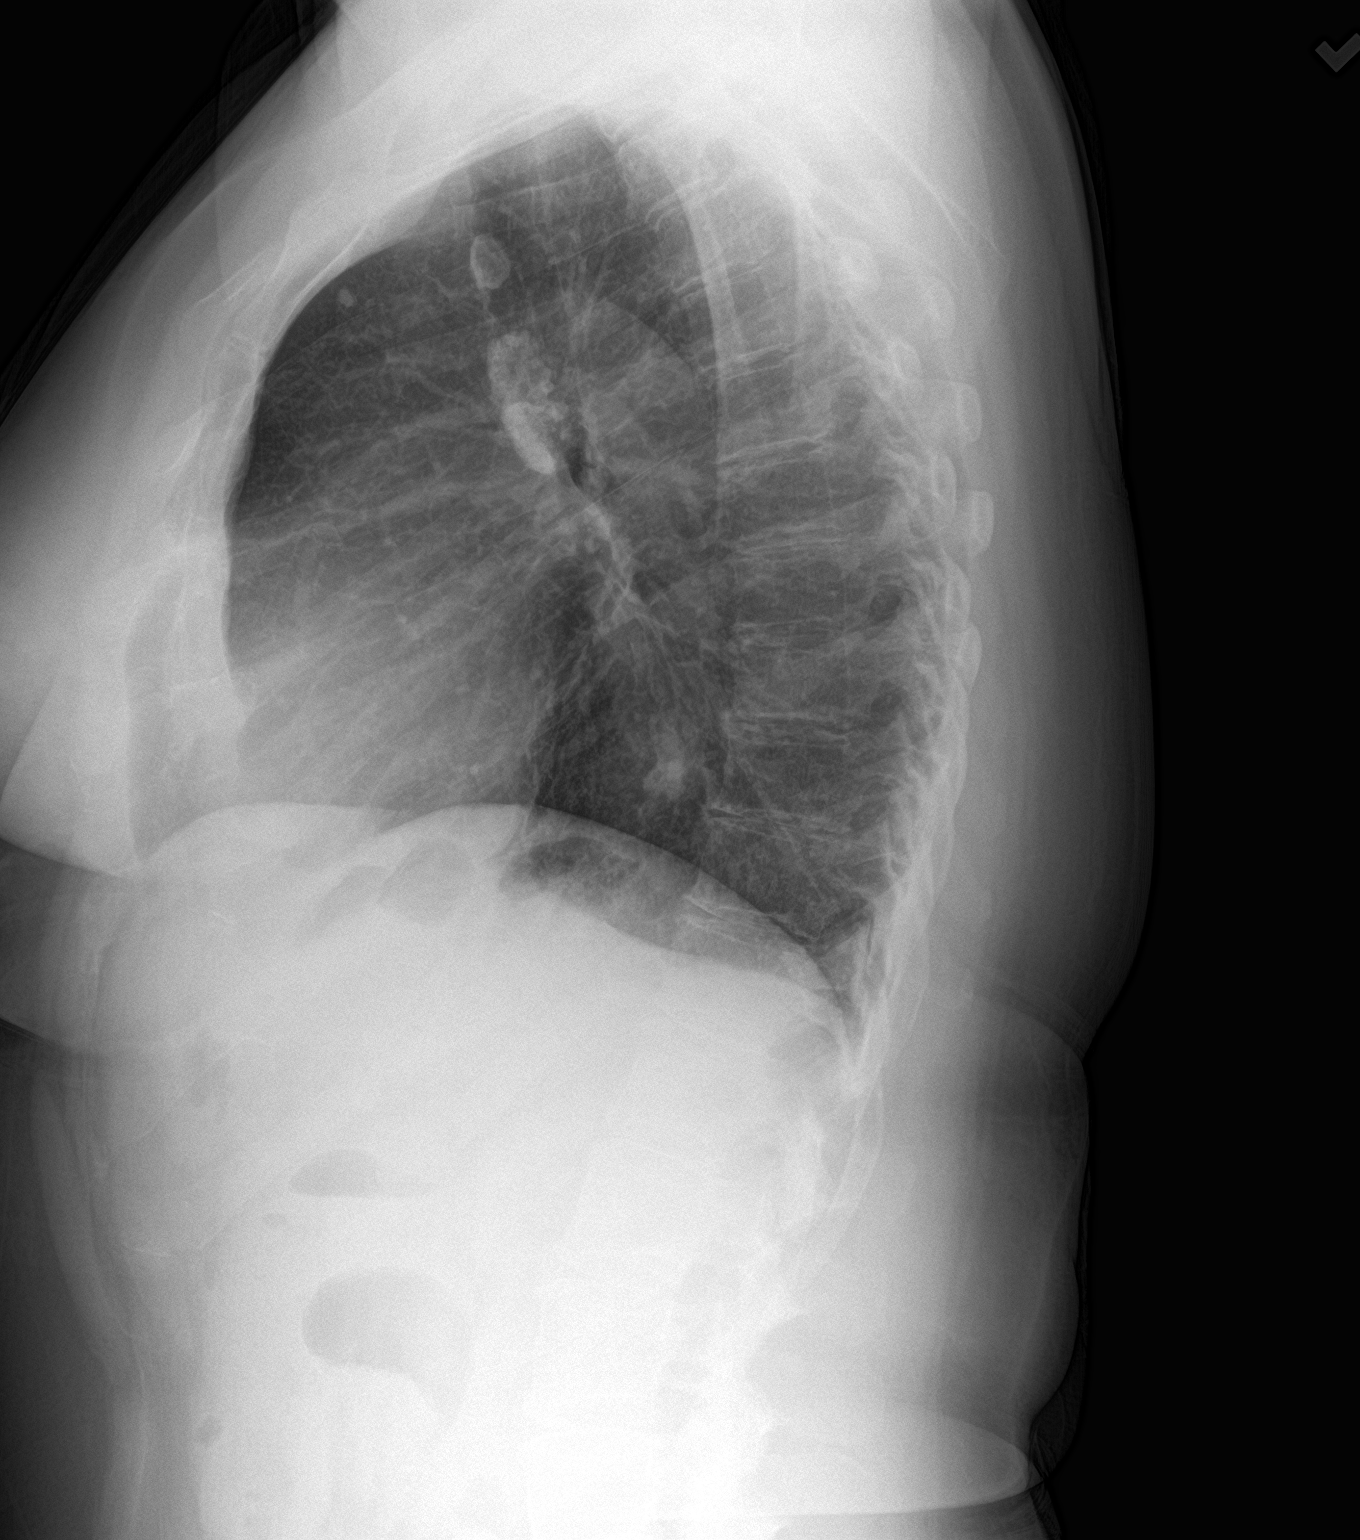

[2 of 2 positions shown; findings below may reference images not displayed]

FINDINGS: The heart size and mediastinal contours are within normal limits.
Multiple calcified mediastinal and left hilar lymph nodes suggesting
sequela of chronic granulomatous disease. Both lungs are clear. The
visualized skeletal structures are unremarkable.
IMPRESSION: 1. No active cardiopulmonary disease.
2. Calcified mediastinal and left hilar lymph nodes suggesting
sequela of granulomatous disease.

## 2022-12-04 ENCOUNTER — Encounter: Payer: Self-pay | Admitting: Family

## 2022-12-04 ENCOUNTER — Ambulatory Visit
Admission: RE | Admit: 2022-12-04 | Discharge: 2022-12-04 | Disposition: A | Discharge status: Home / Self Care | Payer: 59 | Source: Ambulatory Visit | Attending: Family | Admitting: Family

## 2022-12-04 DIAGNOSIS — Z1231 Encounter for screening mammogram for malignant neoplasm of breast: Secondary | ICD-10-CM

## 2022-12-04 NOTE — Telephone Encounter (Signed)
I called and LVM for [facility].

## 2022-12-07 LAB — HM MAMMOGRAPHY

## 2022-12-10 ENCOUNTER — Telehealth: Payer: Self-pay | Admitting: Family

## 2022-12-10 NOTE — Telephone Encounter (Signed)
Pt has requested to transfer care from Dulce Sellar, NP to Dr. Casimiro Needle. Is this transfer okay with both providers?

## 2022-12-11 ENCOUNTER — Encounter: Payer: Self-pay | Admitting: Family

## 2022-12-11 NOTE — Telephone Encounter (Signed)
Ok with me too  

## 2022-12-19 ENCOUNTER — Encounter (HOSPITAL_BASED_OUTPATIENT_CLINIC_OR_DEPARTMENT_OTHER): Payer: Self-pay | Admitting: Orthopaedic Surgery

## 2022-12-19 ENCOUNTER — Ambulatory Visit (HOSPITAL_BASED_OUTPATIENT_CLINIC_OR_DEPARTMENT_OTHER): Payer: 59

## 2022-12-19 ENCOUNTER — Ambulatory Visit (INDEPENDENT_AMBULATORY_CARE_PROVIDER_SITE_OTHER): Payer: 59 | Admitting: Student

## 2022-12-19 DIAGNOSIS — G8929 Other chronic pain: Secondary | ICD-10-CM

## 2022-12-19 DIAGNOSIS — M170 Bilateral primary osteoarthritis of knee: Secondary | ICD-10-CM | POA: Diagnosis not present

## 2022-12-19 NOTE — Progress Notes (Signed)
Chief Complaint: Bilateral knee pain     History of Present Illness:    Kristina Chase is a 62 y.o. female presenting today for evaluation of bilateral knee pain.  She states that this has been ongoing for years but has recently been bothering her significantly.  She says that both knees have throbbing pain which is moderate in severity and does bother her more at night.  She reports a constant popping and grinding sensation with movement.  Worse going up and down stairs as well as remaining in a seated position for long periods of time.  Denies any recent treatment or pain medications.  Both knees feel generally unstable.  She has had a cortisone injection in the past however she is unsure what relief she got.  She did try a series of Synvisc injections approximately 10 years ago while living in [place] and states she only got relief for 3 weeks.  Patient is retired and has been enjoying Programme researcher, broadcasting/film/video.   Surgical History:   None  PMH/PSH/Family History/Social History/Meds/Allergies:    Past Medical History:  Diagnosis Date   Hypothyroid    Rheumatoid arthritis (HCC)    Past Surgical History:  Procedure Laterality Date   APPENDECTOMY     TOTAL ABDOMINAL HYSTERECTOMY     Social History   Socioeconomic History   Marital status: Married    Spouse name: Not on file   Number of children: Not on file   Years of education: Not on file   Highest education level: Not on file  Occupational History   Not on file  Tobacco Use   Smoking status: Never   Smokeless tobacco: Never  Vaping Use   Vaping Use: Never used  Substance and Sexual Activity   Alcohol use: Yes    Alcohol/week: 7.0 standard drinks of alcohol    Types: 7 Glasses of wine per week   Drug use: Never   Sexual activity: Yes  Other Topics Concern   Not on file  Social History Narrative   Not on file   Social Determinants of Health   Financial Resource Strain: Not on file  Food  Insecurity: Not on file  Transportation Needs: Not on file  Physical Activity: Not on file  Stress: Not on file  Social Connections: Not on file   Family History  Problem Relation Age of Onset   Cancer Mother        Breast   Atrial fibrillation Mother    Hypertension Mother    Hypothyroidism Mother    Allergies  Allergen Reactions   Grass Extracts [Gramineae Pollens] Itching   Current Outpatient Medications  Medication Sig Dispense Refill   Cetirizine HCl (ZYRTEC PO) Take by mouth.     cholecalciferol (VITAMIN D3) 25 MCG (1000 UNIT) tablet Take 5,000 Units by mouth daily.     DOTTI 0.1 MG/24HR patch APPLY 1 PATCH TOPICALLY TO SKIN  TWICE WEEKLY 24 patch 3   levothyroxine (SYNTHROID) 125 MCG tablet TAKE 1 TABLET BY MOUTH DAILY  BEFORE BREAKFAST 90 tablet 3   zolpidem (AMBIEN) 5 MG tablet Take 1 tablet (5 mg total) by mouth at bedtime as needed for sleep. 10 tablet 0   No current facility-administered medications for this visit.   No results found.  Review of Systems:   A ROS was performed  including pertinent positives and negatives as documented in the HPI.  Physical Exam :   Constitutional: NAD and appears stated age Neurological: Alert and oriented Psych: Appropriate affect and cooperative There were no vitals taken for this visit.   Comprehensive Musculoskeletal Exam:      Musculoskeletal Exam  Gait Normal  Alignment Normal   Right Left  Inspection Normal Normal  Palpation    Tenderness Lateral joint line Lateral joint line  Crepitus Positive Positive  Effusion None None  Range of Motion    Extension 0 0  Flexion 130 130  Strength    Extension 5/5 5/5  Flexion 5/5 5/5  Ligament Exam     Generalized Laxity No No  Lachman Negative Negative   Valgus at 0 Negative Negative  Valgus at 20 Negative Negative  Varus at 0 0 0  Varus at 20   0 0  Vascular/Lymphatic Exam    Edema None None  Venous Stasis Changes No No  Distal Circulation Normal Normal   Neurologic    Light Touch Sensation Intact Intact  Special Tests:      Imaging:   Xray (left knee 4 views, right knee 4 views): Mild to moderate bilateral tricompartmental osteoarthritis. Small patellar osteophytes bilaterally.   I personally reviewed and interpreted the radiographs.   Assessment:   62 y.o. female with bilateral knee osteoarthritis.  This does appear to be mild to moderate in severity, however I suspect that that patellofemoral components are responsible for the majority of her symptoms.  We did discuss the different treatment options including physical therapy as well as cortisone, hyaluronic acid, and PRP injections.  She has had cortisone and Synvisc in the past seemingly without much relief.  Patient would like to try PRP so I will send in a referral to see Dr. Shon Baton.  Did recommend Voltaren gel as well as resources for at home strengthening exercises.  Will have patient return to clinic for reassessment as needed.  Plan :    - Referral to Dr. Shon Baton for PRP discussion     I personally saw and evaluated the patient, and participated in the management and treatment plan.  Hazle Nordmann, PA-C Orthopedics  This document was dictated using Conservation officer, historic buildings. A reasonable attempt at proof reading has been made to minimize errors.

## 2022-12-28 ENCOUNTER — Ambulatory Visit (INDEPENDENT_AMBULATORY_CARE_PROVIDER_SITE_OTHER): Payer: 59 | Admitting: Sports Medicine

## 2022-12-28 ENCOUNTER — Encounter: Payer: Self-pay | Admitting: Sports Medicine

## 2022-12-28 DIAGNOSIS — M222X1 Patellofemoral disorders, right knee: Secondary | ICD-10-CM

## 2022-12-28 DIAGNOSIS — M170 Bilateral primary osteoarthritis of knee: Secondary | ICD-10-CM

## 2022-12-28 DIAGNOSIS — M222X2 Patellofemoral disorders, left knee: Secondary | ICD-10-CM | POA: Diagnosis not present

## 2022-12-28 NOTE — Progress Notes (Signed)
Kristina Chase - 62 y.o. female MRN [id number]  Date of birth: Dec 21, 1960  Office Visit Note: Visit Date: 12/28/2022 PCP: Dulce Sellar, NP Referred by: Amador Cunas, PA*  Subjective: Chief Complaint  Patient presents with   Right Knee - Pain    Consult for bilateral knee PRP   Left Knee - Pain   HPI: Kristina Chase is a pleasant 62 y.o. female who presents today for bilateral knee osteoarthritis.   She has had bilateral knee pain for many years.  About 10 years ago she was treated for this and had a corticosteroid injection which she did get good relief temporarily.  She then followed this up with a series of Synvisc injections which only gave her very mild relief for a few weeks.  With previous orthopedic physicians, they were hesitant on repeating recurrent corticosteroid injections.  She saw my partner, Hazle Nordmann, we discussed the possibility of PRP injection therapy.  She is going up her back today.  Both of her knees bother her intermittently, but more recently this has bothered her consistently.  Pain is worse with bending, going up and down steps.  The knees do feel like they will give out on her.  She does report patellar crepitus.  Does not take any medication on a consistent basis but does take Excedrin Migraine medicine for her head, last dose was Tuesday.  She has not had a formal physical therapy or PT recently.   Pertinent ROS were reviewed with the patient and found to be negative unless otherwise specified above in HPI.   Assessment & Plan: Visit Diagnoses:  1. Bilateral primary osteoarthritis of knee   2. Patellofemoral arthralgia of both knees    Plan: Discussed with Tahj that her x-rays do not show advanced arthritic change, more of her symptoms and findings are of the patellofemoral joint.  She has received benefit from prior corticosteroid injection, but at this point we are trying to conserve the knee and give her more long-term treatment.  We  discussed proceeding with PRP injection therapy, which she would like to schedule and move forward with this.  We discussed pre and postinjection protocol.  I would like to get her started in formalized physical therapy and give her some home rehab exercises to start about 2 weeks from the injection.  We will start with the left knee as it is the most painful currently and then performed the right knee about 10 days following this.  She is to hold on all NSAIDs for 10 days prior to the injection.  Follow-up: Return for Schedule for L-knee [place] PRP injection at earliest convenience.   Meds & Orders: No orders of the defined types were placed in this encounter.   Orders Placed This Encounter  Procedures   Large Joint Inj: L hip joint     Procedures: No procedures performed      Clinical History: No specialty comments available.  She reports that she has never smoked. She has never used smokeless tobacco. No results for input(s): "HGBA1C", "LABURIC" in the last 8760 hours.  Objective:   Vital Signs: There were no vitals taken for this visit.  Physical Exam  Gen: Well-appearing, in no acute distress; non-toxic CV:  Well-perfused. Warm.  Resp: Breathing unlabored on room air; no wheezing. Psych: Fluid speech in conversation; appropriate affect; normal thought process Neuro: Sensation intact throughout. No gross coordination deficits.   Ortho Exam - Bilateral knees: Varus send mild patellar grind and patellar crepitus noted  of both knees.  The left knee has a small to moderate effusion, right knee trace effusion without significant warmth or redness.  Range of motion is preserved from 0-140 degrees.  No varus or valgus instability.  There is mild left hip lateral abduction weakness compared to the right side.  5/5 strength with knee flexion and extension.  Imaging:  *Independent review and interpretation of bilateral X-rays from 12/19/2022 reviewed and interpreted by myself.  X-rays  demonstrate mild tibiofemoral joint space narrowing of bilateral knees.  There is at least moderate patellofemoral arthritic change left greater than right with some lateral patellar spurring.  Bony spur off the left greater than right patella.  No acute fracture or bony abnormality otherwise noted.  DG Knee Complete 4 Views Left CLINICAL DATA:  Pain  EXAM: LEFT KNEE - COMPLETE 4+ VIEW  COMPARISON:  None Available.  FINDINGS: Mild patellofemoral compartment degenerative changes. No other bony or soft tissue abnormalities identified  IMPRESSION: Mild patellofemoral compartment degenerative changes.  Electronically Signed   By: Gerome Sam III M.D.   On: 12/25/2022 09:17 DG Knee Complete 4 Views Right CLINICAL DATA:  Pain  EXAM: RIGHT KNEE - COMPLETE 4+ VIEW  COMPARISON:  None Available.  FINDINGS: No evidence of fracture, dislocation, or joint effusion. No evidence of arthropathy or other focal bone abnormality. Soft tissues are unremarkable.  IMPRESSION: Negative.  Electronically Signed   By: Gerome Sam III M.D.   On: 12/25/2022 09:16    Past Medical/Family/Surgical/Social History: Medications & Allergies reviewed per EMR, new medications updated. Patient Active Problem List   Diagnosis Date Noted   Circadian rhythm sleep disorder, 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   Allergic rhinitis 02/19/2022   Allergic rhinitis due to pollen 02/19/2022   Osteopenia of neck of right femur 02/19/2022   BMI 30.0-30.9,adult 11/23/2021   Dyslipidemia 11/23/2021   Primary osteoarthritis of both hands 05/01/2021   Primary osteoarthritis of both feet 05/01/2021   LBBB (left bundle branch block) 05/01/2021   Hilar lymphadenopathy 05/01/2021   Hormone replacement therapy (postmenopausal) 04/21/2021   Need for vaccination 04/21/2021   Autoimmune thyroiditis 04/07/2021   Primary osteoarthritis of both knees 04/07/2021   Past Medical History:  Diagnosis Date   Hypothyroid     Rheumatoid arthritis (HCC)    Family History  Problem Relation Age of Onset   Cancer Mother        Breast   Atrial fibrillation Mother    Hypertension Mother    Hypothyroidism Mother    Past Surgical History:  Procedure Laterality Date   APPENDECTOMY     TOTAL ABDOMINAL HYSTERECTOMY     Social History   Occupational History   Not on file  Tobacco Use   Smoking status: Never   Smokeless tobacco: Never  Vaping Use   Vaping Use: Never used  Substance and Sexual Activity   Alcohol use: Yes    Alcohol/week: 7.0 standard drinks of alcohol    Types: 7 Glasses of wine per week   Drug use: Never   Sexual activity: Yes

## 2022-12-28 NOTE — Patient Instructions (Signed)
Dr. Shon Baton' Instructions and What to Expect for PRP Injections:  Platelet-rich plasma is used in musculoskeletal medicine to focus your own body's ability to heal. It has several well-done published randomized control trials (RCT) which demonstrate both its effectiveness and safety in many musculoskeletal conditions, including osteoarthritis, tendinopathies, and damaged vertebral discs. PRP has been in clinical use since the 1990's. Many people know that platelets form a clot if there is a cut in the skin. It turns out that platelets do not only form a clot, they also start the body's own repair process. When platelets activate to form a clot, they also release alpha granules which have hundreds of chemical messengers in them that initiate and organize repair to the damaged tissue. Precisely placing PRP into the site of injury will initiate the healing process by activating on the damaged cartilage or tendon. This is an inflammatory process, and inflammation is the vital first phase of Healing.  What to expect and how to prepare for PRP   2 weeks prior to the procedure: depending on the procedure, you may need to arrange for a driver to bring you home - only if this is your driving leg (right). Most people do not need a cane or crutches, but you can have one around to use if needed for the first few days after injection.   10 days prior to the procedure: Stop taking anti-inflammatory drugs like Ibuprofen/Motrin, Advil, Excedrin Naprosyn, Celebrex, or Meloxicam. Even aspirin should be stopped (but need to discuss this with Dr. Shon Baton and your cardiologist beforehand). Let Dr. Shon Baton know if you have been taking prednisone or other corticosteroids in the last month.   The day before the procedure: thoroughly shower and clean your skin.    The day of the procedure: Wear loose-fitting clothing like sweatpants or shorts. If you are having an upper body procedure wear a top that can button or zip  up.  PRP will initiate healing and a productive inflammation, and PRP therapy will make the body part treated sore for 4 days to two weeks. Anti-inflammatory drugs (i.e. ibuprofen, Naprosyn, Celebrex) and corticosteroids such as prednisone can blunt or stop this process, so it is important to not take any anti-inflammatory drugs for 10 days before getting PRP therapy, or for at least two weeks after PRP therapy. Corticosteroid injections can blunt inflammation for 30 days, so let us know if you have had one recently. Depending on the body part injected, you Should take it easy for the first 1-2 weeks. Just like wringing out a wet dishcloth, if you load or tense a tendon or ligament that has just been injected with PRP, some of the PRP injected will squish out. By keeping the body part treated relaxed, the PRP can bind in place and do its job.   You may need a driver to bring you home.  Tobacco/nicotine is a potent toxin and its use constricts small blood vessels which are needed for tissue repair.  Tobacco/nicotine use will limit the effectiveness of any treatment and stopping tobacco use is one of the single  greatest actions you can take to improve your health. Avoid toxins like alcohol, which inhibits and depresses the cells needed for tissue repair.  What happens during the PRP procedure?  Platelet rich plasma is made by taking some of your blood and performing a two-stage centrifuge process on it to concentrate the PRP. First, your blood is drawn into a syringe with a small amount of anti-coagulant in it (  this is to keep the blood from clotting during this process). The amount of blood drawn is usually about 10-30 milliliters, depending on how much PRP is needed for the treatment.  (There are 355 milliliters in a 12-ounce soda can for comparison).  Then the blood is transferred in a sterile fashion into a centrifuge tube. It is then centrifuged for the first cycle where the red blood  cells are isolated and discarded. In the second centrifuge cycle, the platelet-rich fraction of the remaining plasma is concentrated and placed in a syringe. The skin at the injection site is numbed with a small amount of topical cooling spray. Dr. Shon Baton will then precisely inject the PRP into the injury site using ultrasound guidance.  What to do after your procedure  I will give you specific medicine to control any discomfort you may have after the procedure if you need it. Avoid NSAIDs x 2 weeks for the injection.  Acetaminophen (Tylenol) and applying heat to the area can be used for mild pain.   Depending on the part of the body treated, you can expect some soreness for the first 3-4 days. Do your best not to tense or load the treated area during this time. After 3 days, unless otherwise instructed, the treated body part should be used and slowly moved through its full range of motion. It will be sore, but you will not be doing damage by moving it, in fact it needs to move to heal. If you were on crutches for a period of time, walking is ok once you are off the crutches. For now, avoid activities that specifically hurt you before being treated. Exercise is vital to good health and finding a way to cross train around your injury is important not only for your physical health, but for your mental health as well. Ask me about cross training options for your injury. Some brief (10 minutes or less) period of heat or ice therapy will not hurt the therapy, but it is not required. Usually, depending on the initial injury, physical therapy is started from two weeks to four weeks after injection. Improvements in pain and function should be expected from 8 weeks to 12 weeks after injection and some injuries may require more than one treatment.    Dr. Shon Baton' Guide for Management of Osteoarthritis:  1.)  Keep your body moving - keep working on flexibility and range of motion for the knee.   Good physical activity exercise include: stationary bike, swimming, elliptical.  2.)  Maintain a healthy weight --> 1 pound of body weight = 4 pounds of weight on the knees.  Even small weight loss (5-10+ lbs) can significantly improve pain  3.)  Supplements helpful for arthritis and inflammation: Turmeric (curcumin) Boswellia serrata, collagen hydrolysate, Glucosamine-chondroitin 4.)  Foods helpful for arthritis and inflammation: Fish and foods containing omega-3's, leafy greens (broccoli, spinach), citrus fruits (grapefruit, oranges, lemons), green tea, blueberries, cherries, olive oil (EVO)  *It is very important to stay hydrated, drinking lots of water throughout the day. This helps hydrate structures within the knee.  5.) Medicines:    - Topical pain relievers: Voltaren gel, Arnica gel, Tiger balm, lidocaine/IcyHot patches    - Anti-inflammatories like Aleve, Motrin, ibuprofen --> we can consider prescription based NSAIDs as well if needed **You will hold all of these medicines while doing PRP injection though

## 2023-01-14 ENCOUNTER — Ambulatory Visit (INDEPENDENT_AMBULATORY_CARE_PROVIDER_SITE_OTHER): Payer: 59 | Admitting: Family Medicine

## 2023-01-14 ENCOUNTER — Encounter: Payer: Self-pay | Admitting: Family Medicine

## 2023-01-14 VITALS — BP 90/60 | HR 67 | Temp 98.1°F | Ht 68.0 in | Wt 190.5 lb

## 2023-01-14 DIAGNOSIS — Z803 Family history of malignant neoplasm of breast: Secondary | ICD-10-CM

## 2023-01-14 DIAGNOSIS — E063 Autoimmune thyroiditis: Secondary | ICD-10-CM | POA: Diagnosis not present

## 2023-01-14 DIAGNOSIS — Z1231 Encounter for screening mammogram for malignant neoplasm of breast: Secondary | ICD-10-CM

## 2023-01-14 LAB — TSH: TSH: 0.03 u[IU]/mL — ABNORMAL LOW (ref 0.35–5.50)

## 2023-01-14 NOTE — Assessment & Plan Note (Signed)
Reviewed last TSH which was over-suppressed, pt denies any hyperthyroid sx today, will recheck the TSH since she has been on the same dose for years.

## 2023-01-14 NOTE — Progress Notes (Signed)
Established Patient Office Visit  Subjective   Patient ID: Kristina Chase, female    DOB: 04-Aug-1960  Age: 62 y.o. MRN: [id number]  Chief Complaint  Patient presents with   Establish Care    Pt is here for [facility] visit. She was last seen by Acie Fredrickson in April 2024. Patient reports she is having more dry hair and dry skin, she reports she is feeling tired but no shortness of breath, no palpitations or anxiety.   Patient is going tomorrow to the orthopedist to get an injection in her knee. States that she moved here from [place] about 2 years ago, she reports her mother had breast cancer, states that she was told that she needed breast US every year. States that she already had her mammogram at [facility] which I have reviewed which was negative. Her maternal GM had ovarian cancer in her 29's as well.   Patient is currently on ozempic for weight loss, states that she is doing well on the medication, no side effects, states that she hasn't really seen any weight loss but she is hoping to increase her dose soon. Is an online program called [facility] for this prescription.     Current Outpatient Medications  Medication Instructions   Cetirizine HCl (ZYRTEC PO) Oral   cholecalciferol (VITAMIN D3) 5,000 Units, Oral, Daily   Cyanocobalamin (B-12 PO) 3,000 mcg, Oral, Daily   DOTTI 0.1 MG/24HR patch APPLY 1 PATCH TOPICALLY TO SKIN  TWICE WEEKLY   levothyroxine (SYNTHROID) 125 mcg, Oral, Daily before breakfast   MAGNESIUM PO 500 mg, Oral, Daily   Multiple Vitamin (MULTIVITAMIN PO) Oral, Daily   Omega-3 Fatty Acids (OMEGA 3 PO) 1,000 mg, Oral, Daily   Semaglutide (1 MG/DOSE) 1.7 mg, Injection, Weekly   zolpidem (AMBIEN) 5 mg, Oral, At bedtime PRN    Patient Active Problem List   Diagnosis Date Noted   Circadian rhythm sleep disorder, 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   Allergic rhinitis 02/19/2022   Allergic rhinitis due to pollen 02/19/2022   Osteopenia of neck of right femur 02/19/2022   BMI  30.0-30.9,adult 11/23/2021   Dyslipidemia 11/23/2021   Primary osteoarthritis of both hands 05/01/2021   Primary osteoarthritis of both feet 05/01/2021   LBBB (left bundle branch block) 05/01/2021   Hilar lymphadenopathy 05/01/2021   Hormone replacement therapy (postmenopausal) 04/21/2021   Need for vaccination 04/21/2021   Autoimmune thyroiditis 04/07/2021   Primary osteoarthritis of both knees 04/07/2021      Review of Systems  All other systems reviewed and are negative.     Objective:     BP 90/60 (BP Location: Left Arm, Patient Position: Sitting, Cuff Size: Large)   Pulse 67   Temp 98.1 F (36.7 C) (Oral)   Ht 5\' 8"  (1.727 m)   Wt 190 lb 8 oz (86.4 kg)   SpO2 100%   BMI 28.97 kg/m    Physical Exam Vitals reviewed.  Constitutional:      Appearance: Normal appearance. She is well-groomed and normal weight.  Eyes:     Conjunctiva/sclera: Conjunctivae normal.  Neck:     Thyroid: No thyromegaly.  Cardiovascular:     Rate and Rhythm: Normal rate and regular rhythm.     Pulses: Normal pulses.     Heart sounds: S1 normal and S2 normal.  Pulmonary:     Effort: Pulmonary effort is normal.     Breath sounds: Normal breath sounds and air entry.  Abdominal:     General: Bowel sounds are  normal.  Musculoskeletal:     Right lower leg: No edema.     Left lower leg: No edema.  Neurological:     Mental Status: She is alert and oriented to person, place, and time. Mental status is at baseline.     Gait: Gait is intact.  Psychiatric:        Mood and Affect: Mood and affect normal.        Speech: Speech normal.        Behavior: Behavior normal.        Judgment: Judgment normal.      No results found for any visits on 01/14/23.    The 10-year ASCVD risk score (Arnett DK, et al., 2019) is: 1.4%    Assessment & Plan:  Autoimmune thyroiditis Assessment & Plan: Reviewed last TSH which was over-suppressed, pt denies any hyperthyroid sx today, will recheck the TSH  since she has been on the same dose for years.   Orders: -     TSH  Family history of breast cancer in first degree relative -     US BREAST COMPLETE UNI LEFT INC AXILLA; Future -     US BREAST COMPLETE UNI RIGHT INC AXILLA; Future  Breast cancer screening by mammogram -     US BREAST COMPLETE UNI LEFT INC AXILLA; Future -     US BREAST COMPLETE UNI RIGHT INC AXILLA; Future   Family history positive for mother who had breast cancer and a maternal GM with ovarian CA, pt reports she was tested for the BRCA genes and it was negative. I have placed orders for the [place] of both breasts.  Return in about 39 weeks (around 10/14/2023) for annual physical exam.    Karie Georges, MD

## 2023-01-15 ENCOUNTER — Ambulatory Visit: Payer: 59 | Admitting: Sports Medicine

## 2023-01-15 ENCOUNTER — Other Ambulatory Visit: Payer: Self-pay

## 2023-01-15 ENCOUNTER — Encounter: Payer: Self-pay | Admitting: Sports Medicine

## 2023-01-15 DIAGNOSIS — M170 Bilateral primary osteoarthritis of knee: Secondary | ICD-10-CM

## 2023-01-15 DIAGNOSIS — G8929 Other chronic pain: Secondary | ICD-10-CM

## 2023-01-15 DIAGNOSIS — M25562 Pain in left knee: Secondary | ICD-10-CM

## 2023-01-15 NOTE — Progress Notes (Signed)
Here today for left knee PRP

## 2023-01-15 NOTE — Patient Instructions (Signed)
*  Post-PRP Injection Guidelines:   NO anti-inflammatories (Ibuprofen/Motrin, Aleve, Meloxicam, etc.) for 2 weeks after injection. It is OK to use Tylenol only if needed. Please call the clinic if you need additional pain medicine in the short-term.   NO applying ice to the affected area for 2 weeks after the injection. It is OK to use heat.   Rest the affected body part. By keeping the body part treated relaxed and not loading the body part the PRP can bind in place and do its job. Be sure to ask Dr. Shon Baton specific post-injection rest and guidelines to follow following your injection, as these will differ slightly depending on what type of PRP injection you receive. In general:   Allow 3-4 days of rest from physical labor or repetitive activity for the affected body part. It is ok to move the area, but no lifting or strenuous activity. After 3 days, unless otherwise instructed, the treated body part should be used and slowly moved through its full range of motion. It will be sore, but you will not damage it by moving it, in fact it needs to move to heal.   No strenuous activity, lifting weights, or dedicated therapy until the 2-week mark from the injection. After 2 weeks from the injection, this is when it is most advantageous to start physical therapy.   After two weeks, you may begin returning to activity, but it is a good idea to avoid activities that specifically hurt you before being treated. Ask me about cross training options for your injury.   Depending on the initial injury, usually physical therapy is started about 2 weeks after injection. PRP takes time to work as it facilitates the healing response of your own body, there is not immediate improvement in pain or function. Improvements in pain and function should be expected about 6 weeks to 12 weeks after injection (but will continue to work past this timeframe as well) and some injuries may require more than one PRP treatment.

## 2023-01-15 NOTE — Progress Notes (Signed)
   PRP - Procedure Note  Patient: Kristina Chase             Date of Birth: 02-18-1961           MRN: [id number]             Visit Date: 01/15/2023  Procedures: Visit Diagnoses:  1. Chronic pain of left knee   2. Bilateral primary osteoarthritis of knee    Ultrasound-guided PRP Knee Injection, left: After discussion on risks/benefits/indications, informed verbal consent was obtained and a timeout was performed. The patient was lying supine on exam table with knee bolster underneath affected knee for comfort. The patient's right knee was prepped with Chloraprep and alcohol swabs. Utilizing ultrasound-guidance with the probe in a transverse position, the patient's suprapatellar bursa was identified and the surrounding soft tissue area was anesthesized first with a 25G, 1.5" needle with approximately 3 cc of lidocaine 1% using sterile technique, but none was delivered into the knee joint or bursa itself. Following this, using ultrasound guidance via an in-plane approach, a 22G, 1.5" needle was directed into the suprapatellar pouch of the knee joint and subsequently injected intraarticularly with 5 cc of platelet-rich-plasma (leukocyte-poor). Visualization of injectate spread within the knee joint was visualized dynamically under ultrasound guidance. Patient tolerated the procedure well without immediate complications.   Kit: RegenLab: RegenPlasma; BCT-3 kit   *Post-PRP Injection Guidelines: No anti-inflammatories (ibuprofen/motrin, aleve, meloxicam, etc.) for 2 weeks.  No ice for 2 weeks.  Short prescription of tramadol may be written if pain is severe, call if needed. Appropriate rest / bracing / and timeframe for beginning therapy discussed and patient endorsed understanding.  - She will return > 10 days from today's injection for contralateral knee - At that visit we will plan to send referral to PT for both knees about 2 weeks from last PRP injection  Madelyn Brunner, DO Primary Care Sports  Medicine Physician  [facility]  This note was dictated using Dragon naturally speaking software and may contain errors in syntax, spelling, or content which have not been identified prior to signing this note.

## 2023-01-30 ENCOUNTER — Ambulatory Visit: Payer: Self-pay | Admitting: Sports Medicine

## 2023-01-30 ENCOUNTER — Other Ambulatory Visit: Payer: Self-pay

## 2023-01-30 ENCOUNTER — Encounter: Payer: Self-pay | Admitting: Sports Medicine

## 2023-01-30 DIAGNOSIS — M25561 Pain in right knee: Secondary | ICD-10-CM

## 2023-01-30 DIAGNOSIS — M170 Bilateral primary osteoarthritis of knee: Secondary | ICD-10-CM

## 2023-01-30 DIAGNOSIS — G8929 Other chronic pain: Secondary | ICD-10-CM

## 2023-01-30 NOTE — Patient Instructions (Signed)
*  Post-PRP Injection Guidelines:   NO anti-inflammatories (Ibuprofen/Motrin, Aleve, Meloxicam, etc.) for 2 weeks after injection. It is OK to use Tylenol only if needed. Please call the clinic if you need additional pain medicine in the short-term.   NO applying ice to the affected area for 2 weeks after the injection. It is OK to use heat.   Rest the affected body part. By keeping the body part treated relaxed and not loading the body part the PRP can bind in place and do its job. Be sure to ask Dr. Shon Baton specific post-injection rest and guidelines to follow following your injection, as these will differ slightly depending on what type of PRP injection you receive. In general:   Allow 3-4 days of rest from physical labor or repetitive activity for the affected body part. It is ok to move the area, but no lifting or strenuous activity. After 3 days, unless otherwise instructed, the treated body part should be used and slowly moved through its full range of motion. It will be sore, but you will not damage it by moving it, in fact it needs to move to heal.   No strenuous activity, lifting weights, or dedicated therapy until the 2-week mark from the injection. After 2 weeks from the injection, this is when it is most advantageous to start physical therapy.   After two weeks, you may begin returning to activity, but it is a good idea to avoid activities that specifically hurt you before being treated. Ask me about cross training options for your injury.   Depending on the initial injury, usually physical therapy is started about 2 weeks after injection. PRP takes time to work as it facilitates the healing response of your own body, there is not immediate improvement in pain or function. Improvements in pain and function should be expected about 6 weeks to 12 weeks after injection (but will continue to work past this timeframe as well) and some injuries may require more than one PRP treatment.

## 2023-01-30 NOTE — Progress Notes (Signed)
   Procedure Note  Patient: Kristina Chase             Date of Birth: 04-Dec-1960           MRN: [id number]             Visit Date: 01/30/2023  Procedures: Visit Diagnoses:  1. Chronic pain of right knee   2. Bilateral primary osteoarthritis of knee    Ultrasound-guided PRP Knee Injection, right: After discussion on risks/benefits/indications, informed verbal consent was obtained and a timeout was performed. The patient was lying supine on exam table with knee bolster underneath affected knee for comfort. The patient's right knee was prepped with Chloraprep and alcohol swabs. Utilizing ultrasound-guidance with the probe in a transverse position, the patient's suprapatellar bursa was identified and the surrounding soft tissue area was anesthesized first with a 25G, 1.5" needle with approximately 3 cc of lidocaine 1% using sterile technique, but none was delivered into the knee joint or bursa itself. Following this, using ultrasound guidance via an in-plane approach, a 22G, 1.5" needle was directed into the suprapatellar pouch of the knee joint and subsequently injected intraarticularly with 5.5 cc of platelet-rich-plasma (leukocyte-poor). Visualization of injectate spread within the knee joint was visualized dynamically under ultrasound guidance. Patient tolerated the procedure well without immediate complications.   Kit: RegenLab: RegenPlasma; BCT-3 kit   *Post-PRP Injection Guidelines: No anti-inflammatories (ibuprofen/motrin, aleve, meloxicam, etc.) for 2 weeks.  No ice for 2 weeks.  Short prescription of tramadol may be written if pain is severe, call if needed. Appropriate rest / bracing / and timeframe for beginning therapy discussed and patient endorsed understanding. Remainder of info placed in AVS.  - referral to formal PT for bilateral knee pain and osteoarthritis --> will wait to start 2 weeks from today's injection  Madelyn Brunner, DO Primary Care Sports Medicine Physician  [facility]  This note was dictated using Dragon naturally speaking software and may contain errors in syntax, spelling, or content which have not been identified prior to signing this note.

## 2023-02-02 ENCOUNTER — Encounter: Payer: Self-pay | Admitting: Family Medicine

## 2023-02-02 DIAGNOSIS — G4725 Circadian rhythm sleep disorder, jet lag type: Secondary | ICD-10-CM

## 2023-02-06 MED ORDER — ZOLPIDEM TARTRATE 5 MG PO TABS
5.0000 mg | ORAL_TABLET | Freq: Every evening | ORAL | 0 refills | Status: DC | PRN
Start: 2023-02-06 — End: 2023-10-17

## 2023-02-14 ENCOUNTER — Ambulatory Visit: Payer: 59 | Admitting: Physical Therapy

## 2023-02-17 ENCOUNTER — Telehealth: Payer: 59 | Admitting: Family

## 2023-02-17 DIAGNOSIS — U071 COVID-19: Secondary | ICD-10-CM

## 2023-02-17 MED ORDER — NIRMATRELVIR/RITONAVIR (PAXLOVID)TABLET
3.0000 | ORAL_TABLET | Freq: Two times a day (BID) | ORAL | 0 refills | Status: AC
Start: 2023-02-17 — End: 2023-02-22

## 2023-02-17 NOTE — Progress Notes (Signed)
Virtual Visit Consent   Kristina Chase, you are scheduled for a virtual visit with a [facility] provider today. Just as with appointments in the office, your consent must be obtained to participate. Your consent will be active for this visit and any virtual visit you may have with one of our providers in the next 365 days. If you have a MyChart account, a copy of this consent can be sent to you electronically.  As this is a virtual visit, video technology does not allow for your provider to perform a traditional examination. This may limit your provider's ability to fully assess your condition. If your provider identifies any concerns that need to be evaluated in person or the need to arrange testing (such as labs, EKG, etc.), we will make arrangements to do so. Although advances in technology are sophisticated, we cannot ensure that it will always work on either your end or our end. If the connection with a video visit is poor, the visit may have to be switched to a telephone visit. With either a video or telephone visit, we are not always able to ensure that we have a secure connection.  By engaging in this virtual visit, you consent to the provision of healthcare and authorize for your insurance to be billed (if applicable) for the services provided during this visit. Depending on your insurance coverage, you may receive a charge related to this service.  I need to obtain your verbal consent now. Are you willing to proceed with your visit today? Tacoya Clinch has provided verbal consent on 02/17/2023 for a virtual visit (video or telephone). Jannifer Rodney, FNP  Date: 02/17/2023 8:45 AM  Virtual Visit via Video Note   I, Jannifer Rodney, connected with  Kristina Chase  ([id number], 1961/05/28) on 02/17/23 at  8:45 AM EDT by a video-enabled telemedicine application and verified that I am speaking with the correct person using two identifiers.  Location: Patient: Virtual Visit Location Patient:  Home Provider: Virtual Visit Location Provider: Home Office   I discussed the limitations of evaluation and management by telemedicine and the availability of in person appointments. The patient expressed understanding and agreed to proceed.    History of Present Illness: Kristina Chase is a 62 y.o. who identifies as a female who was assigned female at birth, and is being seen today for COVID. She reports her symptoms started 4 days ago and tested positive this morning.   HPI: URI  This is a new problem. The current episode started in the past 7 days. The problem has been unchanged. Associated symptoms include congestion, diarrhea, ear pain, headaches, a plugged ear sensation and a sore throat. She has tried acetaminophen and increased fluids for the symptoms. The treatment provided mild relief.    Problems:  Patient Active Problem List   Diagnosis Date Noted   Circadian rhythm sleep disorder, 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   Allergic rhinitis 02/19/2022   Allergic rhinitis due to pollen 02/19/2022   Osteopenia of neck of right femur 02/19/2022   BMI 30.0-30.9,adult 11/23/2021   Dyslipidemia 11/23/2021   Primary osteoarthritis of both hands 05/01/2021   Primary osteoarthritis of both feet 05/01/2021   LBBB (left bundle branch block) 05/01/2021   Hilar lymphadenopathy 05/01/2021   Hormone replacement therapy (postmenopausal) 04/21/2021   Need for vaccination 04/21/2021   Autoimmune thyroiditis 04/07/2021   Primary osteoarthritis of both knees 04/07/2021    Allergies:  Allergies  Allergen Reactions   Grass Extracts [Gramineae Pollens] Itching   Medications:  Current Outpatient Medications:    nirmatrelvir/ritonavir (PAXLOVID) 20 x 150 MG & 10 x 100MG  TABS, Take 3 tablets by mouth 2 (two) times daily for 5 days., Disp: 30 tablet, Rfl: 0   Cetirizine HCl (ZYRTEC PO), Take by mouth., Disp: , Rfl:    cholecalciferol (VITAMIN D3) 25 MCG (1000 UNIT) tablet, Take 5,000 Units by mouth daily.,  Disp: , Rfl:    Cyanocobalamin (B-12 PO), Take 3,000 mcg by mouth daily., Disp: , Rfl:    DOTTI 0.1 MG/24HR patch, APPLY 1 PATCH TOPICALLY TO SKIN  TWICE WEEKLY, Disp: 24 patch, Rfl: 3   levothyroxine (SYNTHROID) 125 MCG tablet, TAKE 1 TABLET BY MOUTH DAILY  BEFORE BREAKFAST, Disp: 90 tablet, Rfl: 3   MAGNESIUM PO, Take 500 mg by mouth daily., Disp: , Rfl:    Multiple Vitamin (MULTIVITAMIN PO), Take by mouth daily., Disp: , Rfl:    Omega-3 Fatty Acids (OMEGA 3 PO), Take 1,000 mg by mouth daily., Disp: , Rfl:    Semaglutide, 1 MG/DOSE, 4 MG/3ML SOPN, Inject 1.7 mg as directed once a week., Disp: , Rfl:    zolpidem (AMBIEN) 5 MG tablet, Take 1 tablet (5 mg total) by mouth at bedtime as needed for sleep., Disp: 7 tablet, Rfl: 0  Observations/Objective: Patient is well-developed, well-nourished in no acute distress.  Resting comfortably  at home.  Head is normocephalic, atraumatic.  No labored breathing.  Speech is clear and coherent with logical content.  Patient is alert and oriented at baseline.  Nasal congestion  Assessment and Plan: 1. COVID-19 - nirmatrelvir/ritonavir (PAXLOVID) 20 x 150 MG & 10 x 100MG  TABS; Take 3 tablets by mouth 2 (two) times daily for 5 days.  Dispense: 30 tablet; Refill: 0  COVID positive, rest, force fluids, tylenol as needed,  report any worsening symptoms such as increased shortness of breath, swelling, or continued high fevers. Possible adverse effects discussed with antivirals.  Given symptoms, she does not need antivirals at this time. I have prescribed medications and if symptoms worsen today or tomorrow she can start. She will use Mucinex as needed.     Follow Up Instructions: I discussed the assessment and treatment plan with the patient. The patient was provided an opportunity to ask questions and all were answered. The patient agreed with the plan and demonstrated an understanding of the instructions.  A copy of instructions were sent to the patient via  MyChart unless otherwise noted below.     The patient was advised to call back or seek an in-person evaluation if the symptoms worsen or if the condition fails to improve as anticipated.  Time:  I spent 11 minutes with the patient via telehealth technology discussing the above problems/concerns.    Jannifer Rodney, FNP

## 2023-02-19 ENCOUNTER — Ambulatory Visit: Payer: 59 | Admitting: Physical Therapy

## 2023-02-26 ENCOUNTER — Ambulatory Visit: Payer: 59 | Admitting: Physical Therapy

## 2023-02-26 ENCOUNTER — Other Ambulatory Visit: Payer: Self-pay

## 2023-02-26 ENCOUNTER — Encounter: Payer: Self-pay | Admitting: Physical Therapy

## 2023-02-26 DIAGNOSIS — R262 Difficulty in walking, not elsewhere classified: Secondary | ICD-10-CM | POA: Diagnosis not present

## 2023-02-26 DIAGNOSIS — M25561 Pain in right knee: Secondary | ICD-10-CM

## 2023-02-26 DIAGNOSIS — M25562 Pain in left knee: Secondary | ICD-10-CM

## 2023-02-26 DIAGNOSIS — M6281 Muscle weakness (generalized): Secondary | ICD-10-CM

## 2023-02-26 DIAGNOSIS — G8929 Other chronic pain: Secondary | ICD-10-CM

## 2023-02-26 NOTE — Therapy (Addendum)
OUTPATIENT PHYSICAL THERAPY LOWER EXTREMITY EVALUATION  / DISCHARGE   Patient Name: Kristina Chase MRN: [id number] DOB:08/12/1960, 62 y.o., female Today's Date: 02/26/2023  END OF SESSION:   02/26/23 1233  PT Visits / Re-Eval  Visit Number 1  Number of Visits 6  Date for PT Re-Evaluation 04/12/23  PT Time Calculation  PT Start Time 0925  PT Stop Time 1010  PT Time Calculation (min) 45 min  PT - End of Session  Activity Tolerance Patient tolerated treatment well  Behavior During Therapy [facility] for tasks assessed/performed      Past Medical History:  Diagnosis Date   Hypothyroid    Rheumatoid arthritis (HCC)    Past Surgical History:  Procedure Laterality Date   APPENDECTOMY     TOTAL ABDOMINAL HYSTERECTOMY     Patient Active Problem List   Diagnosis Date Noted   Circadian rhythm sleep disorder, 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   Allergic rhinitis 02/19/2022   Allergic rhinitis due to pollen 02/19/2022   Osteopenia of neck of right femur 02/19/2022   BMI 30.0-30.9,adult 11/23/2021   Dyslipidemia 11/23/2021   Primary osteoarthritis of both hands 05/01/2021   Primary osteoarthritis of both feet 05/01/2021   LBBB (left bundle branch block) 05/01/2021   Hilar lymphadenopathy 05/01/2021   Hormone replacement therapy (postmenopausal) 04/21/2021   Need for vaccination 04/21/2021   Autoimmune thyroiditis 04/07/2021   Primary osteoarthritis of both knees 04/07/2021    PCP: Karie Georges, MD   REFERRING PROVIDER: Madelyn Brunner, DO   REFERRING DIAG:  Diagnosis  M25.561,G89.29 (ICD-10-CM) - Chronic pain of right knee    THERAPY DIAG:  Chronic pain of Lt knee  Chronic pain of Rt knee  Difficulty in walking  Muscle weakness  Rationale for Evaluation and Treatment: Rehabilitation  ONSET DATE: several years ago  SUBJECTIVE:   SUBJECTIVE STATEMENT: Pt arriving reporting bilateral knee pain. Pt stating the MD stated it may be patellofemoral joint.  Pt stating no  pain on Rt knee at present since injection. Pt reporting achyiness in her left knee.   PERTINENT HISTORY: 01/15/23: left knee injection 01/30/23: Rt knee injection Rheumatoid arthritis, hypothyroidism, abdominal hysterectomy   PAIN:  NPRS scale: 3/10 Pain location: anterior patella Pain description:  Aggravating factors: walking, stairs, can hurt when she rolls over in bed Relieving factors: using gel before injections  PRECAUTIONS: None  WEIGHT BEARING RESTRICTIONS: No  FALLS:  Has patient fallen in last 6 months? No  LIVING ENVIRONMENT: Lives with: lives with their family and lives with their spouse Lives in: House/apartment Stairs: Yes: Internal: 13 steps; on right going up Has following equipment at home: None  OCCUPATION: not currently working  PLOF: Independent  PATIENT GOALS: walk without pain, get a good home base program to work on  Next MD visit: unknown   OBJECTIVE:   DIAGNOSTIC FINDINGS: FINDINGS: 12/19/22 Mild patellofemoral compartment degenerative changes. No other bony  or soft tissue abnormalities identified . X-rays demonstrate mild tibiofemoral joint space narrowing of bilateral knees.   PATIENT SURVEYS:  02/26/23: FOTO intake:   63%    COGNITION: Overall cognitive status: WFL    SENSATION: WFL  MUSCLE LENGTH: Hamstrings: Right 68 deg; Left 64 deg   POSTURE:  rounded shoulders and forward head  PALPATION: Medial tenderness on bil knees inferior patella tendon  LOWER EXTREMITY ROM:   ROM Right 02/26/23 Left 02/26/23  Hip flexion 110 108  Hip extension    Hip abduction    Hip adduction    Hip  internal rotation    Hip external rotation    Knee flexion 134 130  Knee extension 0 0  Ankle dorsiflexion    Ankle plantarflexion    Ankle inversion    Ankle eversion     (Blank rows = not tested)  LOWER EXTREMITY MMT:  MMT Right 02/26/23 Left 02/26/23  Hip flexion 4- 4-  Hip extension    Hip abduction 4+ 4+  Hip adduction     Hip internal rotation    Hip external rotation    Knee flexion 4 4  Knee extension 5 5  Ankle dorsiflexion 5 5  Ankle plantarflexion    Ankle inversion    Ankle eversion     (Blank rows = not tested)  LOWER EXTREMITY SPECIAL TESTS:  Rt knee pain with medial compression Left knee pain with medial compression  FUNCTIONAL TESTS:  02/26/23 5 time sit to stand:   GAIT: Distance walked: 30 feet level surface in clinic gym Assistive device utilized: None Level of assistance: Complete Independence Comments: WFL                                                                                                                                                                        TODAY'S TREATMENT                                                                          DATE: 02/26/23 Therex:    HEP instruction/performance c cues for techniques, handout provided.  Trial set performed of each for comprehension and symptom assessment.  See below for exercise list  PATIENT EDUCATION:  Education details: HEP, POC Person educated: Patient Education method: Explanation, Demonstration, Verbal cues, and Handouts Education comprehension: verbalized understanding, returned demonstration, and verbal cues required  HOME EXERCISE PROGRAM: Access Code: [email or web address] URL: [email or web address] Date: 02/26/2023 Prepared by: Narda Amber  Exercises - Supine Bridge  - 2 x daily - 2 sets - 10 reps - 5 seconds hold - Supine Active Straight Leg Raise  - 2 x daily - 2 sets - 10 reps - Supine Single Knee to Chest Stretch  - 2 x daily - 3 reps - 20 seconds hold - Supine Figure 4 Piriformis Stretch  - 2 x daily - 3 reps - 20 seconds hold - Standing Hip Flexor Stretch  - 2 x daily - 3 reps - 20 seconds hold - Seated Hamstring Stretch  - 2 x daily - 3 reps - 20  seconds hold - Mini Squat with Counter Support  - 1-2 x daily - 10 reps  ASSESSMENT:  CLINICAL IMPRESSION: Patient is a 62 y.o. who  comes to clinic with complaints of chronic Rt knee pain with mobility, strength and movement coordination deficits that impair their ability to perform usual daily and recreational functional activities without increase difficulty/symptoms at this time.  Patient to benefit from skilled PT services to address impairments and limitations to improve to previous level of function without restriction secondary to condition.   OBJECTIVE IMPAIRMENTS: difficulty walking, decreased ROM, decreased strength, impaired flexibility, and pain.   ACTIVITY LIMITATIONS: lifting, bending, standing, squatting, sleeping, stairs, and transfers  PARTICIPATION LIMITATIONS: cleaning, laundry, shopping, and community activity  PERSONAL FACTORS: 3+ comorbidities: see pertinent history above  are also affecting patient's functional outcome.   REHAB POTENTIAL: Good  CLINICAL DECISION MAKING: Stable/uncomplicated  EVALUATION COMPLEXITY: Low   GOALS: Goals reviewed with patient? Yes  SHORT TERM GOALS: (target date for Short term goals are 3 weeks 03/22/23)   1.  Patient will demonstrate independent use of home exercise program to maintain progress from in clinic treatments.  Goal status: New  LONG TERM GOALS: (target dates for all long term goals are 10 weeks  04/12/23 )   1. Patient will demonstrate/report pain at worst less than or equal to 2/10 to facilitate minimal limitation in daily activity secondary to pain symptoms.  Goal status: New   2. Patient will demonstrate independent use of home exercise program to facilitate ability to maintain/progress functional gains from skilled physical therapy services.  Goal status: New   3. Patient will demonstrate FOTO outcome > or = 69 % to indicate reduced disability due to condition.  Goal status: New   4.  Patient will demonstrate bilateral  LE MMT 5/5 throughout to faciltiate usual transfers, stairs, squatting at [facility] for daily life.   Goal status: New   5.   Patient will demonstrate up and down 1 flight of stairs with reciprocal gait pattern with single hand rail with knee pain of 2/10 or less.   Goal status: New   6.  pt will improve 5 time sit to stand to </= 12 seconds with no UE support with equalized weight on bil LE's.   Goal status: New      PLAN:  PT FREQUENCY: 1x/week to  every other week as needed  PT DURATION: 6 weeks  PLANNED INTERVENTIONS: Therapeutic exercises, Therapeutic activity, Neuro Muscular re-education, Balance training, Gait training, Patient/Family education, Joint mobilization, Stair training, DME instructions, Dry Needling, Electrical stimulation, Traction, Cryotherapy, vasopneumatic deviceMoist heat, Taping, Ultrasound, Ionotophoresis 4mg /ml Dexamethasone, and aquatic therapy, Manual therapy.  All included unless contraindicated  PLAN FOR NEXT SESSION: Review HEP knowledge/results, quad strengthening, hip and hamstring stretching, update HEP as appropriate  Sharmon Leyden, PT,  MPT 02/26/2023, 7:51 AM   PHYSICAL THERAPY DISCHARGE SUMMARY  Visits from Start of Care: 1  Current functional level related to goals / functional outcomes: See note   Remaining deficits: See note   Education / Equipment: HEP  Patient goals were not met. Patient is being discharged due to not returning since the last visit.  Chyrel Masson, PT, DPT, OCS, ATC 04/04/23  2:19 PM

## 2023-03-12 ENCOUNTER — Encounter: Payer: 59 | Admitting: Physical Therapy

## 2023-03-26 ENCOUNTER — Encounter: Payer: 59 | Admitting: Physical Therapy

## 2023-04-24 ENCOUNTER — Other Ambulatory Visit: Payer: Self-pay | Admitting: Family

## 2023-04-24 DIAGNOSIS — E063 Autoimmune thyroiditis: Secondary | ICD-10-CM

## 2023-06-05 ENCOUNTER — Other Ambulatory Visit: Payer: Self-pay | Admitting: Medical Genetics

## 2023-06-12 ENCOUNTER — Other Ambulatory Visit: Payer: Self-pay | Admitting: Medical Genetics

## 2023-06-15 ENCOUNTER — Encounter: Payer: Self-pay | Admitting: Family Medicine

## 2023-06-15 DIAGNOSIS — Z79890 Hormone replacement therapy (postmenopausal): Secondary | ICD-10-CM

## 2023-06-17 ENCOUNTER — Telehealth: Payer: Self-pay | Admitting: Family Medicine

## 2023-06-17 ENCOUNTER — Other Ambulatory Visit (HOSPITAL_COMMUNITY): Payer: Self-pay

## 2023-06-17 MED ORDER — ESTRADIOL 0.1 MG/24HR TD PTTW
1.0000 | MEDICATED_PATCH | TRANSDERMAL | 1 refills | Status: DC
Start: 2023-06-17 — End: 2023-08-19

## 2023-06-17 NOTE — Telephone Encounter (Signed)
She is just using it once a week

## 2023-06-17 NOTE — Telephone Encounter (Signed)
Pt states [facility] called her to say there is a discrepancy with the instructions.  Pt is asking we call [facility] to clarify.

## 2023-06-17 NOTE — Telephone Encounter (Signed)
Contacted pt pharmacy. Spoke to [facility], pharmacist.  Relay message to him. He is able to change it to weekly at their end. He states they will fill it for pt.

## 2023-06-17 NOTE — Telephone Encounter (Signed)
Prescription Request  06/17/2023  LOV: 01/14/2023  What is the name of the medication or equipment?       DOTTI 0.1 MG/24HR patch  Have you contacted your pharmacy to request a refill? Yes   Which pharmacy would you like this sent to?  [id number] - [place], [place] - [address] AT [facility] OF [address] & [facility] [address] Phone: [phone]  Fax: [phone]     Patient notified that their request is being sent to the clinical staff for review and that they should receive a response within 2 business days.   Please advise at Mobile [phone] (mobile)

## 2023-06-17 NOTE — Telephone Encounter (Signed)
estradiol (DOTTI) 0.1 MG/24HR patch 12 patch 1 06/17/2023 --   Sig - Route: Place 1 patch (0.1 mg total) onto the skin once a week. - Transdermal   Sent to pharmacy as: Estradiol 0.1 MG/24HR Transdermal Patch Twice Weekly    Spoke to pharmacy. They are asking to clarify direction for Estradiol patch. Is pt using once a week or twice a week? Please advise.

## 2023-06-25 ENCOUNTER — Other Ambulatory Visit: Payer: Self-pay | Admitting: Family Medicine

## 2023-06-25 DIAGNOSIS — E063 Autoimmune thyroiditis: Secondary | ICD-10-CM

## 2023-06-26 NOTE — Telephone Encounter (Signed)
Left a detailed message with the information below at the patient's cell number. ?

## 2023-06-26 NOTE — Telephone Encounter (Signed)
Pt was supposed to have a TSH done in October--- can you have her schedule the TSH lab appointment -- we might need to adjust her dose since her previous TSH was too low... I'll call in 90 days of this medication for now,

## 2023-07-16 ENCOUNTER — Encounter: Payer: Self-pay | Admitting: Family Medicine

## 2023-07-18 ENCOUNTER — Encounter: Payer: Self-pay | Admitting: Family Medicine

## 2023-07-18 ENCOUNTER — Other Ambulatory Visit (INDEPENDENT_AMBULATORY_CARE_PROVIDER_SITE_OTHER): Payer: 59

## 2023-07-18 DIAGNOSIS — E063 Autoimmune thyroiditis: Secondary | ICD-10-CM

## 2023-07-18 LAB — TSH: TSH: 0.09 u[IU]/mL — ABNORMAL LOW (ref 0.35–5.50)

## 2023-07-19 MED ORDER — LEVOTHYROXINE SODIUM 112 MCG PO TABS: 112.0000 ug | ORAL_TABLET | Freq: Every day | ORAL | 1 refills | Status: DC

## 2023-08-02 ENCOUNTER — Other Ambulatory Visit: Payer: Self-pay | Admitting: Family Medicine

## 2023-08-02 DIAGNOSIS — E063 Autoimmune thyroiditis: Secondary | ICD-10-CM

## 2023-08-18 ENCOUNTER — Encounter: Payer: Self-pay | Admitting: Family Medicine

## 2023-08-18 DIAGNOSIS — Z79890 Hormone replacement therapy (postmenopausal): Secondary | ICD-10-CM

## 2023-08-19 MED ORDER — DOTTI 0.1 MG/24HR TD PTTW: 1.0000 | MEDICATED_PATCH | TRANSDERMAL | 1 refills | Status: DC

## 2023-10-17 ENCOUNTER — Encounter: Payer: Self-pay | Admitting: Family Medicine

## 2023-10-17 ENCOUNTER — Ambulatory Visit (INDEPENDENT_AMBULATORY_CARE_PROVIDER_SITE_OTHER): Payer: 59 | Admitting: Family Medicine

## 2023-10-17 ENCOUNTER — Encounter: Payer: 59 | Admitting: Family

## 2023-10-17 VITALS — BP 100/58 | HR 65 | Temp 97.6°F | Ht 67.5 in | Wt 194.0 lb

## 2023-10-17 DIAGNOSIS — Z0000 Encounter for general adult medical examination without abnormal findings: Secondary | ICD-10-CM

## 2023-10-17 DIAGNOSIS — E063 Autoimmune thyroiditis: Secondary | ICD-10-CM | POA: Diagnosis not present

## 2023-10-17 DIAGNOSIS — Z1231 Encounter for screening mammogram for malignant neoplasm of breast: Secondary | ICD-10-CM

## 2023-10-17 DIAGNOSIS — Z13220 Encounter for screening for lipoid disorders: Secondary | ICD-10-CM | POA: Diagnosis not present

## 2023-10-17 LAB — TSH: TSH: 0.7 u[IU]/mL (ref 0.35–5.50)

## 2023-10-17 LAB — COMPREHENSIVE METABOLIC PANEL WITH GFR
ALT: 99 U/L — ABNORMAL HIGH (ref 0–35)
AST: 36 U/L (ref 0–37)
Albumin: 4 g/dL (ref 3.5–5.2)
Alkaline Phosphatase: 118 U/L — ABNORMAL HIGH (ref 39–117)
BUN: 20 mg/dL (ref 6–23)
CO2: 31 meq/L (ref 19–32)
Calcium: 9.1 mg/dL (ref 8.4–10.5)
Chloride: 100 meq/L (ref 96–112)
Creatinine, Ser: 0.69 mg/dL (ref 0.40–1.20)
GFR: 93.87 mL/min (ref >60.00)
Glucose, Bld: 84 mg/dL (ref 70–99)
Potassium: 4.9 meq/L (ref 3.5–5.1)
Sodium: 136 meq/L (ref 135–145)
Total Bilirubin: 0.6 mg/dL (ref 0.2–1.2)
Total Protein: 6.9 g/dL (ref 6.0–8.3)

## 2023-10-17 LAB — CBC WITH DIFFERENTIAL/PLATELET
Basophils Absolute: 0.1 10*3/uL (ref 0.0–0.1)
Basophils Relative: 0.8 % (ref 0.0–3.0)
Eosinophils Absolute: 0.3 10*3/uL (ref 0.0–0.7)
Eosinophils Relative: 4.8 % (ref 0.0–5.0)
HCT: 42.1 % (ref 36.0–46.0)
Hemoglobin: 13.8 g/dL (ref 12.0–15.0)
Lymphocytes Relative: 33.7 % (ref 12.0–46.0)
Lymphs Abs: 2.1 10*3/uL (ref 0.7–4.0)
MCHC: 32.7 g/dL (ref 30.0–36.0)
MCV: 90.6 fl (ref 78.0–100.0)
Monocytes Absolute: 0.5 10*3/uL (ref 0.1–1.0)
Monocytes Relative: 8.7 % (ref 3.0–12.0)
Neutro Abs: 3.2 10*3/uL (ref 1.4–7.7)
Neutrophils Relative %: 52 % (ref 43.0–77.0)
Platelets: 253 10*3/uL (ref 150.0–400.0)
RBC: 4.64 Mil/uL (ref 3.87–5.11)
RDW: 14 % (ref 11.5–15.5)
WBC: 6.3 10*3/uL (ref 4.0–10.5)

## 2023-10-17 LAB — LIPID PANEL
Cholesterol: 202 mg/dL — ABNORMAL HIGH (ref 0–200)
HDL: 74.4 mg/dL (ref >39.00)
LDL Cholesterol: 113 mg/dL — ABNORMAL HIGH (ref 0–99)
NonHDL: 127.13
Total CHOL/HDL Ratio: 3
Triglycerides: 69 mg/dL (ref 0.0–149.0)
VLDL: 13.8 mg/dL (ref 0.0–40.0)

## 2023-10-17 NOTE — Patient Instructions (Signed)

## 2023-10-17 NOTE — Progress Notes (Signed)
 Complete physical exam  Patient: Kristina Chase   DOB: May 03, 1961   63 y.o. Female  MRN: [id number]  Subjective:    Chief Complaint  Patient presents with   Annual Exam    Kristina Chase is a 63 y.o. female who presents today for a complete physical exam. She reports consuming a general diet. The patient does not participate in regular exercise at present. She generally feels well. She reports sleeping somewhat poorly, states that she has random nighttime awakenings, sometimes has trouble falling back asleep. She does not have additional problems to discuss today.   Takes melatonin at night, also takes magnesium and cetirizine at nighttime.  Most recent fall risk assessment:    10/16/2022   10:54 AM  Fall Risk   Falls in the past year? 0  Number falls in past yr: 0  Injury with Fall? 0  Risk for fall due to : No Fall Risks  Follow up Falls evaluation completed;Education provided     Most recent depression screenings:    10/17/2023    9:17 AM 10/16/2022   10:54 AM  PHQ 2/9 Scores  PHQ - 2 Score 5 0  PHQ- 9 Score 8 1   No current vision conditions  Vision:Within last year and Dental: No current dental problems and Receives regular dental care  Patient Active Problem List   Diagnosis Date Noted   Circadian rhythm sleep disorder, 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   Allergic rhinitis 02/19/2022   Allergic rhinitis due to pollen 02/19/2022   Osteopenia of neck of right femur 02/19/2022   BMI 30.0-30.9,adult 11/23/2021   Dyslipidemia 11/23/2021   Primary osteoarthritis of both hands 05/01/2021   Primary osteoarthritis of both feet 05/01/2021   LBBB (left bundle branch block) 05/01/2021   Hilar lymphadenopathy 05/01/2021   Hormone replacement therapy (postmenopausal) 04/21/2021   Need for vaccination 04/21/2021   Autoimmune thyroiditis 04/07/2021   Primary osteoarthritis of both knees 04/07/2021      Patient Care Team: Karie Georges, MD as PCP - General (Family Medicine)    Outpatient Medications Prior to Visit  Medication Sig   Cetirizine HCl (ZYRTEC PO) Take by mouth.   Cyanocobalamin (B-12 PO) Take 3,000 mcg by mouth daily.   DOTTI 0.1 MG/24HR patch Place 1 patch (0.1 mg total) onto the skin once a week.   levothyroxine (SYNTHROID) 112 MCG tablet Take 1 tablet (112 mcg total) by mouth daily.   MAGNESIUM PO Take 500 mg by mouth daily.   Multiple Vitamin (MULTIVITAMIN PO) Take by mouth daily.   Omega-3 Fatty Acids (OMEGA 3 PO) Take 1,000 mg by mouth daily.   tirzepatide (ZEPBOUND) 2.5 MG/0.5ML Pen Inject 2.5 mg into the skin once a week.   [DISCONTINUED] Semaglutide, 1 MG/DOSE, 4 MG/3ML SOPN Inject 1.7 mg as directed once a week.   [DISCONTINUED] zolpidem (AMBIEN) 5 MG tablet Take 1 tablet (5 mg total) by mouth at bedtime as needed for sleep.   No facility-administered medications prior to visit.    Review of Systems  HENT:  Negative for hearing loss.   Eyes:  Negative for blurred vision.  Respiratory:  Negative for shortness of breath.   Cardiovascular:  Negative for chest pain.  Gastrointestinal: Negative.   Genitourinary: Negative.   Musculoskeletal:  Negative for back pain.  Neurological:  Negative for headaches.  Psychiatric/Behavioral:  Negative for depression.        Objective:     BP (!) 100/58   Pulse 65   Temp 97.6  F (36.4 C) (Oral)   Ht 5' 7.5" (1.715 m)   Wt 194 lb (88 kg)   SpO2 100%   BMI 29.94 kg/m    Physical Exam Vitals reviewed.  Constitutional:      Appearance: Normal appearance. She is well-groomed and overweight.  HENT:     Right Ear: Tympanic membrane and ear canal normal.     Left Ear: Tympanic membrane and ear canal normal.     Mouth/Throat:     Mouth: Mucous membranes are moist.     Pharynx: No posterior oropharyngeal erythema.  Eyes:     Conjunctiva/sclera: Conjunctivae normal.  Neck:     Thyroid: No thyromegaly.  Cardiovascular:     Rate and Rhythm: Normal rate and regular rhythm.     Pulses:  Normal pulses.     Heart sounds: S1 normal and S2 normal.  Pulmonary:     Effort: Pulmonary effort is normal.     Breath sounds: Normal breath sounds and air entry.  Abdominal:     General: Bowel sounds are normal.     Palpations: Abdomen is soft.     Tenderness: There is no abdominal tenderness.  Musculoskeletal:     Right lower leg: No edema.     Left lower leg: No edema.  Lymphadenopathy:     Cervical: No cervical adenopathy.  Neurological:     Mental Status: She is alert and oriented to person, place, and time. Mental status is at baseline.     Gait: Gait is intact.  Psychiatric:        Mood and Affect: Mood and affect normal.        Speech: Speech normal.        Behavior: Behavior normal.        Judgment: Judgment normal.      No results found for any visits on 10/17/23.     Assessment & Plan:    Routine Health Maintenance and Physical Exam  Immunization History  Administered Date(s) Administered   Influenza,inj,Quad PF,6+ Mos 04/21/2021, 03/05/2022   PFIZER(Purple Top)SARS-COV-2 Vaccination 11/09/2019, 11/30/2019, 06/15/2020, 12/07/2020, 04/24/2021, 11/07/2021   Tdap 10/17/2022   Zoster Recombinant(Shingrix) 09/09/2017, 11/15/2017    Health Maintenance  Topic Date Due   COVID-19 Vaccine (7 - 2024-25 season) 03/10/2023   INFLUENZA VACCINE  02/07/2024   MAMMOGRAM  12/06/2024   Colonoscopy  10/08/2030   DTaP/Tdap/Td (2 - Td or Tdap) 10/16/2032   Hepatitis C Screening  Completed   HIV Screening  Completed   Zoster Vaccines- Shingrix  Completed   HPV VACCINES  Aged Out   Meningococcal B Vaccine  Aged Out    Discussed health benefits of physical activity, and encouraged her to engage in regular exercise appropriate for her age and condition.  Autoimmune thyroiditis -     TSH; Future  Lipid screening -     Lipid panel; Future  Routine general medical examination at a health care facility -     CBC with Differential/Platelet; Future -     Comprehensive  metabolic panel with GFR; Future  Breast cancer screening by mammogram -     3D Screening Mammogram, Left and Right; Future   Normal physical exam findings. I counseled the patient on increasing physical activity and also counseled patient on healthy sleep habits and stress management. Handouts given also on these topics. PHQ was slightly elevated, states she had to call hospice in for her mother who is terminally ill. I provided verbal support and offered intervention for  her if she felt like she needed it. Labs ordered for annual surveillance, TSH also to check on the hypothyroidism.   Return in 1 year (on 10/16/2024).     Karie Georges, MD

## 2023-10-18 ENCOUNTER — Encounter: Payer: Self-pay | Admitting: Family Medicine

## 2023-10-18 DIAGNOSIS — R945 Abnormal results of liver function studies: Secondary | ICD-10-CM

## 2023-10-20 NOTE — Progress Notes (Signed)
 Addressed in previous message

## 2023-10-24 ENCOUNTER — Telehealth: Payer: Self-pay | Admitting: Family Medicine

## 2023-10-24 NOTE — Telephone Encounter (Signed)
 Copied from CRM [id number]. Topic: Appointments - Scheduling Inquiry for Clinic >> Oct 24, 2023 10:33 AM Chuck Crater wrote: Reason for CRM: Patient wants to schedule an ultrasound of the liver per Dr. Bambi Lever.

## 2023-10-25 ENCOUNTER — Other Ambulatory Visit: Payer: Self-pay | Admitting: Family

## 2023-10-25 DIAGNOSIS — Z79890 Hormone replacement therapy (postmenopausal): Secondary | ICD-10-CM

## 2023-10-28 ENCOUNTER — Encounter: Payer: Self-pay | Admitting: Family Medicine

## 2023-10-28 ENCOUNTER — Telehealth: Payer: Self-pay | Admitting: Family Medicine

## 2023-10-28 NOTE — Telephone Encounter (Signed)
 Copied from CRM [id number]. Topic: General - Other >> Oct 25, 2023  4:29 PM Chuck Crater wrote: Reason for CRM: Patient wants to know if the mammogram orders were sent to [facility]. She states that she has not been contacted by them yet.

## 2023-11-04 ENCOUNTER — Other Ambulatory Visit: Payer: Self-pay | Admitting: Family

## 2023-11-04 ENCOUNTER — Encounter: Payer: Self-pay | Admitting: Family Medicine

## 2023-11-04 DIAGNOSIS — E063 Autoimmune thyroiditis: Secondary | ICD-10-CM

## 2023-11-04 DIAGNOSIS — Z79890 Hormone replacement therapy (postmenopausal): Secondary | ICD-10-CM

## 2023-11-04 MED ORDER — LEVOTHYROXINE SODIUM 112 MCG PO TABS: 112.0000 ug | ORAL_TABLET | Freq: Every day | ORAL | 1 refills | Status: DC

## 2023-11-05 ENCOUNTER — Ambulatory Visit
Admission: RE | Admit: 2023-11-05 | Discharge: 2023-11-05 | Disposition: A | Discharge status: Home / Self Care | Source: Ambulatory Visit | Attending: Family Medicine

## 2023-11-05 ENCOUNTER — Encounter: Payer: Self-pay | Admitting: Family Medicine

## 2023-11-05 DIAGNOSIS — R945 Abnormal results of liver function studies: Secondary | ICD-10-CM

## 2023-11-07 ENCOUNTER — Encounter: Payer: Self-pay | Admitting: Family Medicine

## 2023-11-08 ENCOUNTER — Telehealth: Payer: Self-pay

## 2023-11-08 NOTE — Telephone Encounter (Signed)
 Copied from CRM [id number]. Topic: Clinical - Prescription Issue >> Nov 08, 2023  4:35 PM Star East wrote: Reason for CRM: levothyroxine  (SYNTHROID ) 112 MCG tablet and Dot- still waiting for the prescriptions to be sent to [facility]

## 2023-11-09 MED ORDER — ESTRADIOL 0.1 MG/24HR TD PTTW: 1.0000 | MEDICATED_PATCH | TRANSDERMAL | 1 refills | Status: DC

## 2023-11-09 MED ORDER — LEVOTHYROXINE SODIUM 112 MCG PO TABS
112.0000 ug | ORAL_TABLET | Freq: Every day | ORAL | 1 refills | Status: DC
Start: 2023-11-09 — End: 2024-02-20

## 2023-11-09 NOTE — Addendum Note (Signed)
 Addended by: Aida House on: 11/09/2023 03:03 PM   Modules accepted: Orders

## 2023-11-11 NOTE — Telephone Encounter (Signed)
 See My chart message

## 2023-12-12 ENCOUNTER — Encounter: Payer: Self-pay | Admitting: Family Medicine

## 2023-12-12 LAB — HM MAMMOGRAPHY

## 2023-12-13 ENCOUNTER — Ambulatory Visit: Payer: Self-pay | Admitting: Family Medicine

## 2023-12-23 ENCOUNTER — Encounter: Payer: Self-pay | Admitting: Podiatry

## 2023-12-23 ENCOUNTER — Ambulatory Visit (INDEPENDENT_AMBULATORY_CARE_PROVIDER_SITE_OTHER)

## 2023-12-23 ENCOUNTER — Ambulatory Visit (INDEPENDENT_AMBULATORY_CARE_PROVIDER_SITE_OTHER): Admitting: Podiatry

## 2023-12-23 DIAGNOSIS — M2041 Other hammer toe(s) (acquired), right foot: Secondary | ICD-10-CM

## 2023-12-23 DIAGNOSIS — M2042 Other hammer toe(s) (acquired), left foot: Secondary | ICD-10-CM | POA: Diagnosis not present

## 2023-12-23 NOTE — Progress Notes (Addendum)
  Subjective:  Patient ID: Kristina Chase, female    DOB: Oct 30, 1960,   MRN: [id number]  Chief Complaint  Patient presents with   Kristina Chase Toe    Rm8/ Hammertoe bilateral Kristina Chase years/l    63 y.o. female presents for concern of bilateral hammertoes that have been present for many years. Relates the left second toe is the most painful for her and rubs on tops of her shoes and also gets pain in the ball of her foot. She has tried different shoes and padding's and no relief for the left foot.  She does have a history of RA. Denies diabetes.   . Denies any other pedal complaints. Denies n/v/f/c.   Past Medical History:  Diagnosis Date   Hypothyroid    Rheumatoid arthritis (HCC)     Objective:  Physical Exam: Vascular: DP/PT pulses 2/4 bilateral. CFT <3 seconds. Normal hair growth on digits. No edema.  Skin. No lacerations or abrasions bilateral feet.  Musculoskeletal: MMT 5/5 bilateral lower extremities in DF, PF, Inversion and Eversion. Deceased ROM in DF of ankle joint. Hammered digits 2-5 bilateral. Rigid deformity noted to left second digit with some dorsiflexion noted at MPJ. Flexibility ntoed to remiaing digits.  Neurological: Sensation intact to light touch.   Assessment:   1. Hammertoe of left foot   2. Hammertoe of right foot      Plan:  Patient was evaluated and treated and all questions answered. -X-rays reviewed. No acute fractures or dislocations noted. Hammered digits 2-5 bilateral.  -Educated on hammertoes and treatment options  -Discussed padding including toe caps and crest pads.  -Discussed need for potential surgery if pain does not improved. Relates the second toe she has exhausted conservative treatment and would like to consider surgery. Discussed hamemrtoe repair on left second digit and periooperative course with patient.  -Surgical shoe dispensed for post-op state.  -Informed surgical risk consent was reviewed and read aloud to the patient.  I reviewed the  films.  I have discussed my findings with the patient in great detail.  I have discussed all risks including but not limited to infection, stiffness, scarring, limp, disability, deformity, damage to blood vessels and nerves, numbness, poor healing, need for braces, arthritis, chronic pain, amputation, death.  All benefits and realistic expectations discussed in great detail.  I have made no promises as to the outcome.  I have provided realistic expectations.  I have offered the patient a 2nd opinion, which they have declined and assured me they preferred to proceed despite the risks. -Plan for surgery some time in August.  Post-op meds: Zofran, keflex , oxycodone.   Kristina Chase, DPM

## 2023-12-24 ENCOUNTER — Telehealth: Payer: Self-pay | Admitting: Podiatry

## 2023-12-24 NOTE — Telephone Encounter (Signed)
 Pt would like quote for her surgery on 02/11/24 but is changing insurances on 01/07/24. She is to upload the cards today. Only procedure is  Cpt [id number] hammertoe repair.

## 2023-12-24 NOTE — Telephone Encounter (Signed)
 Pt called and left message stating she seen Dr Alvah Auerbach yesterday and they had discussed surgery on 8/5 and pt had not heard anything,  I found paperwork and she is scheduled for 02/11/24 and is not on blood thinners does take zepbound and is aware has to hold 1 wk prior to surgery.She understood. I verified pharmacy as well. She is asking for a quote and is uploading her new insurance that starts 01/07/24 into my chart.

## 2023-12-25 NOTE — Telephone Encounter (Signed)
 Card is scanned under media its bcbs

## 2024-01-06 NOTE — Telephone Encounter (Signed)
 Pt called back about getting an estimate for her surgery speaking to [facility] and she it to let pt know that the insurance does not take effect until 01/07/24 and they will not give us  information until the plan is active.Levon was telling pt she would get a call back once our billing checks her benefits.

## 2024-01-13 NOTE — Telephone Encounter (Signed)
 Notified pt that it is still not verifying on our end for the billing to check benefits. I recommended she call the insurance company.   She said she can log in and it shows active on her end.

## 2024-02-04 ENCOUNTER — Telehealth: Payer: Self-pay | Admitting: Podiatry

## 2024-02-04 NOTE — Telephone Encounter (Signed)
 DOS- 02/11/2024  HAMMERTOE REPAIR LOCAL BLOCK LT- 28285  [employer]BS EFFECTIVE DATE- 01/07/2024  DEDUCTIBLE- $7000 REMAINING- $7000 OOP- $9200 REMAINING- $9200 COINSURANCE- 50% PER SERVICE YEAR NO COPAY  PER FAX RECEIVED THROUGH [facility], PRIOR AUTH HAS BEEN APPROVED FROM 02/11/2024-04/10/2024.  AUTH # [id number] 02/04/2024

## 2024-02-11 ENCOUNTER — Other Ambulatory Visit: Payer: Self-pay | Admitting: Podiatry

## 2024-02-11 ENCOUNTER — Encounter: Payer: Self-pay | Admitting: Podiatry

## 2024-02-11 DIAGNOSIS — M2042 Other hammer toe(s) (acquired), left foot: Secondary | ICD-10-CM | POA: Diagnosis not present

## 2024-02-11 MED ORDER — OXYCODONE-ACETAMINOPHEN 5-325 MG PO TABS: 1.0000 | ORAL_TABLET | ORAL | 0 refills | Status: DC | PRN

## 2024-02-11 MED ORDER — TRAMADOL HCL 50 MG PO TABS: 50.0000 mg | ORAL_TABLET | Freq: Three times a day (TID) | ORAL | 0 refills | Status: AC | PRN

## 2024-02-11 MED ORDER — CEPHALEXIN 500 MG PO CAPS: 500.0000 mg | ORAL_CAPSULE | Freq: Three times a day (TID) | ORAL | 2 refills | Status: DC

## 2024-02-11 MED ORDER — ONDANSETRON HCL 4 MG PO TABS: 4.0000 mg | ORAL_TABLET | Freq: Three times a day (TID) | ORAL | 0 refills | Status: DC | PRN

## 2024-02-18 ENCOUNTER — Encounter: Admitting: Podiatry

## 2024-02-18 ENCOUNTER — Encounter: Payer: Self-pay | Admitting: Family Medicine

## 2024-02-19 ENCOUNTER — Ambulatory Visit (INDEPENDENT_AMBULATORY_CARE_PROVIDER_SITE_OTHER)

## 2024-02-19 ENCOUNTER — Ambulatory Visit (INDEPENDENT_AMBULATORY_CARE_PROVIDER_SITE_OTHER): Admitting: Podiatry

## 2024-02-19 ENCOUNTER — Encounter: Payer: Self-pay | Admitting: Podiatry

## 2024-02-19 VITALS — BP 121/64 | HR 66 | Temp 97.3°F

## 2024-02-19 DIAGNOSIS — Z9889 Other specified postprocedural states: Secondary | ICD-10-CM

## 2024-02-19 DIAGNOSIS — M2042 Other hammer toe(s) (acquired), left foot: Secondary | ICD-10-CM

## 2024-02-19 NOTE — Progress Notes (Signed)
  Subjective:  Patient ID: Kristina Chase, female    DOB: 11-28-1960,  MRN: [id number]  Chief Complaint  Patient presents with   Routine Post Op    POV # 1 DOS 02/11/24 LT 2ND HAMMERTOE REPAIR It's doing great.  I get an occasional burst of pain.  I only took the pain medication for pain  My pain level is a one.    DOS: 02/11/24  Procedure: Left second hammertoe repair    63 y.o. female returns for POV#1. Relates doing well. Som pain but did not need much medication.   Review of Systems: Negative except as noted in the HPI. Denies N/V/F/Ch.  Past Medical History:  Diagnosis Date   Hypothyroid    Rheumatoid arthritis (HCC)     Current Outpatient Medications:    cephALEXin  (KEFLEX ) 500 MG capsule, Take 1 capsule (500 mg total) by mouth 3 (three) times daily., Disp: 30 capsule, Rfl: 2   Cetirizine HCl (ZYRTEC PO), Take by mouth., Disp: , Rfl:    Cyanocobalamin (B-12 PO), Take 3,000 mcg by mouth daily., Disp: , Rfl:    estradiol  (VIVELLE -DOT) 0.1 MG/24HR patch, Place 1 patch (0.1 mg total) onto the skin 2 (two) times a week., Disp: 25 patch, Rfl: 1   levothyroxine  (SYNTHROID ) 112 MCG tablet, Take 1 tablet (112 mcg total) by mouth daily., Disp: 90 tablet, Rfl: 1   MAGNESIUM PO, Take 500 mg by mouth daily., Disp: , Rfl:    Multiple Vitamin (MULTIVITAMIN PO), Take by mouth daily., Disp: , Rfl:    Omega-3 Fatty Acids (OMEGA 3 PO), Take 1,000 mg by mouth daily., Disp: , Rfl:    ondansetron  (ZOFRAN ) 4 MG tablet, Take 1 tablet (4 mg total) by mouth every 8 (eight) hours as needed for nausea or vomiting. (Patient not taking: Reported on 02/19/2024), Disp: 20 tablet, Rfl: 0   Tirzepatide-Weight Management (ZEPBOUND [place]), Inject 10 mg into the skin once a week., Disp: , Rfl:   Social History   Tobacco Use  Smoking Status Never  Smokeless Tobacco Never    Allergies  Allergen Reactions   Grass Extracts [Gramineae Pollens] Itching   Objective:   Vitals:   02/19/24 1004  BP: 121/64  Pulse:  66  Temp: (!) 97.3 F (36.3 C)   There is no height or weight on file to calculate BMI. Constitutional Well developed. Well nourished.  Vascular Foot warm and well perfused. Capillary refill normal to all digits.   Neurologic Normal speech. Oriented to person, place, and time. Epicritic sensation to light touch grossly present bilaterally.  Dermatologic Skin healing well without signs of infection. Skin edges well coapted without signs of infection.  Orthopedic: Tenderness to palpation noted about the surgical site.   Radiographs: Hardware intact and toe well aligned  Assessment:   1. Status post surgery   2. Hammertoe of left foot    Plan:  Patient was evaluated and treated and all questions answered.  S/p foot surgery left -Progressing as expected post-operatively. -WB Status: WBAT in surgical shoe  -Sutures: intact . -Medications: n/a -Foot redressed.  Return in 2 weeks for suture removal.   Return in about 3 weeks (around 03/11/2024) for post op.

## 2024-02-20 ENCOUNTER — Other Ambulatory Visit: Payer: Self-pay | Admitting: Family Medicine

## 2024-02-20 DIAGNOSIS — E063 Autoimmune thyroiditis: Secondary | ICD-10-CM

## 2024-03-03 ENCOUNTER — Ambulatory Visit (INDEPENDENT_AMBULATORY_CARE_PROVIDER_SITE_OTHER): Admitting: Podiatry

## 2024-03-03 ENCOUNTER — Encounter: Payer: Self-pay | Admitting: Podiatry

## 2024-03-03 VITALS — BP 118/64 | HR 72 | Temp 98.8°F

## 2024-03-03 DIAGNOSIS — Z9889 Other specified postprocedural states: Secondary | ICD-10-CM

## 2024-03-03 DIAGNOSIS — M2042 Other hammer toe(s) (acquired), left foot: Secondary | ICD-10-CM

## 2024-03-03 NOTE — Progress Notes (Signed)
  Subjective:  Patient ID: Kristina Chase, female    DOB: [birth year],  MRN: [id number]  Chief Complaint  Patient presents with   Routine Post Op    POV # 2 DOS 02/11/24 LT 2ND HAMMERTOE REPAIR It's good.  My pain level is a zero.    DOS: 02/11/24  Procedure: Left second hammertoe repair    63 y.o. female returns for POV#2. Relates doing well.   Review of Systems: Negative except as noted in the HPI. Denies N/V/F/Ch.  Past Medical History:  Diagnosis Date   Hypothyroid    Rheumatoid arthritis (HCC)     Current Outpatient Medications:    Cetirizine HCl (ZYRTEC PO), Take by mouth., Disp: , Rfl:    Cyanocobalamin (B-12 PO), Take 3,000 mcg by mouth daily., Disp: , Rfl:    estradiol  (VIVELLE -DOT) 0.1 MG/24HR patch, Place 1 patch (0.1 mg total) onto the skin 2 (two) times a week., Disp: 25 patch, Rfl: 1   levothyroxine  (SYNTHROID ) 112 MCG tablet, Take 1 tablet by mouth daily., Disp: 90 tablet, Rfl: 0   MAGNESIUM PO, Take 500 mg by mouth daily., Disp: , Rfl:    Multiple Vitamin (MULTIVITAMIN PO), Take by mouth daily., Disp: , Rfl:    Omega-3 Fatty Acids (OMEGA 3 PO), Take 1,000 mg by mouth daily., Disp: , Rfl:    ondansetron  (ZOFRAN ) 4 MG tablet, Take 1 tablet (4 mg total) by mouth every 8 (eight) hours as needed for nausea or vomiting. (Patient not taking: Reported on 02/19/2024), Disp: 20 tablet, Rfl: 0   Tirzepatide-Weight Management (ZEPBOUND [place]), Inject 10 mg into the skin once a week., Disp: , Rfl:    cephALEXin  (KEFLEX ) 500 MG capsule, Take 1 capsule (500 mg total) by mouth 3 (three) times daily., Disp: 30 capsule, Rfl: 2  Social History   Tobacco Use  Smoking Status Never  Smokeless Tobacco Never    Allergies  Allergen Reactions   Grass Extracts [Gramineae Pollens] Itching   Objective:   There were no vitals filed for this visit.  There is no height or weight on file to calculate BMI. Constitutional Well developed. Well nourished.  Vascular Foot warm and well  perfused. Capillary refill normal to all digits.   Neurologic Normal speech. Oriented to person, place, and time. Epicritic sensation to light touch grossly present bilaterally.  Dermatologic Skin healing well without signs of infection. Skin edges well coapted without signs of infection.  Orthopedic: Tenderness to palpation noted about the surgical site.   Radiographs: Hardware intact and toe well aligned  Assessment:   1. Status post surgery   2. Hammertoe of left foot     Plan:  Patient was evaluated and treated and all questions answered.  S/p foot surgery left -Progressing as expected post-operatively. -WB Status: WBAT in surgical shoe  -Sutures: removed today without incident.  -Medications: n/a -Foot redressed.  Return in 1 week for pin removal.    Return in about 1 week (around 03/10/2024) for post op.

## 2024-03-10 ENCOUNTER — Ambulatory Visit (INDEPENDENT_AMBULATORY_CARE_PROVIDER_SITE_OTHER): Admitting: Podiatry

## 2024-03-10 ENCOUNTER — Encounter: Payer: Self-pay | Admitting: Podiatry

## 2024-03-10 DIAGNOSIS — Z9889 Other specified postprocedural states: Secondary | ICD-10-CM

## 2024-03-10 NOTE — Progress Notes (Signed)
  Subjective:  Patient ID: Kristina Chase, female    DOB: 1960/10/20,  MRN: [id number]  Chief Complaint  Patient presents with   Routine Post Op    POV # 3 DOS 02/11/24 LT 2ND HAMMERTOE REPAIR It's great.  I never bumped my toes during the whole month.    DOS: 02/11/24  Procedure: Left second hammertoe repair    63 y.o. female returns for POV#3. Relates doing well.   Review of Systems: Negative except as noted in the HPI. Denies N/V/F/Ch.  Past Medical History:  Diagnosis Date   Hypothyroid    Rheumatoid arthritis (HCC)     Current Outpatient Medications:    Cetirizine HCl (ZYRTEC PO), Take by mouth., Disp: , Rfl:    Cyanocobalamin (B-12 PO), Take 3,000 mcg by mouth daily., Disp: , Rfl:    estradiol  (VIVELLE -DOT) 0.1 MG/24HR patch, Place 1 patch (0.1 mg total) onto the skin 2 (two) times a week., Disp: 25 patch, Rfl: 1   levothyroxine  (SYNTHROID ) 112 MCG tablet, Take 1 tablet by mouth daily., Disp: 90 tablet, Rfl: 0   MAGNESIUM PO, Take 500 mg by mouth daily., Disp: , Rfl:    Multiple Vitamin (MULTIVITAMIN PO), Take by mouth daily., Disp: , Rfl:    Omega-3 Fatty Acids (OMEGA 3 PO), Take 1,000 mg by mouth daily., Disp: , Rfl:    ondansetron  (ZOFRAN ) 4 MG tablet, Take 1 tablet (4 mg total) by mouth every 8 (eight) hours as needed for nausea or vomiting. (Patient not taking: Reported on 02/19/2024), Disp: 20 tablet, Rfl: 0   Tirzepatide-Weight Management (ZEPBOUND [place]), Inject 10 mg into the skin once a week., Disp: , Rfl:    cephALEXin  (KEFLEX ) 500 MG capsule, Take 1 capsule (500 mg total) by mouth 3 (three) times daily., Disp: 30 capsule, Rfl: 2  Social History   Tobacco Use  Smoking Status Never  Smokeless Tobacco Never    Allergies  Allergen Reactions   Grass Extracts [Gramineae Pollens] Itching   Objective:   There were no vitals filed for this visit.  There is no height or weight on file to calculate BMI. Constitutional Well developed. Well nourished.  Vascular Foot  warm and well perfused. Capillary refill normal to all digits.   Neurologic Normal speech. Oriented to person, place, and time. Epicritic sensation to light touch grossly present bilaterally.  Dermatologic Skin healing well without signs of infection. Skin edges well coapted without signs of infection.  Orthopedic: Tenderness to palpation noted about the surgical site.   Assessment:   1. Status post surgery      Plan:  Patient was evaluated and treated and all questions answered.  S/p foot surgery left -Progressing as expected post-operatively. -WB Status: WBAT in regular shoe.  -Sutures: Pin removed today in total without incident.  -Medications: n/a -Foot redressed.  Return in 3 weeks for recheck.   Return in about 3 weeks (around 03/31/2024) for post op.

## 2024-03-31 ENCOUNTER — Ambulatory Visit (INDEPENDENT_AMBULATORY_CARE_PROVIDER_SITE_OTHER): Admitting: Podiatry

## 2024-03-31 ENCOUNTER — Encounter: Payer: Self-pay | Admitting: Podiatry

## 2024-03-31 ENCOUNTER — Ambulatory Visit (INDEPENDENT_AMBULATORY_CARE_PROVIDER_SITE_OTHER)

## 2024-03-31 DIAGNOSIS — M2042 Other hammer toe(s) (acquired), left foot: Secondary | ICD-10-CM

## 2024-03-31 NOTE — Progress Notes (Signed)
  Subjective:  Patient ID: Kristina Chase, female    DOB: May 21, 1961,  MRN: [id number]  Chief Complaint  Patient presents with   Routine Post Op    POV #4 DOS 02/11/24 LT 2ND HAMMERTOE REPAIR It's good.    DOS: 02/11/24  Procedure: Left second hammertoe repair    63 y.o. female returns for POV#4. Relates doing well.   Review of Systems: Negative except as noted in the HPI. Denies N/V/F/Ch.  Past Medical History:  Diagnosis Date   Hypothyroid    Rheumatoid arthritis (HCC)     Current Outpatient Medications:    Cetirizine HCl (ZYRTEC PO), Take by mouth., Disp: , Rfl:    Cyanocobalamin (B-12 PO), Take 3,000 mcg by mouth daily., Disp: , Rfl:    estradiol  (VIVELLE -DOT) 0.1 MG/24HR patch, Place 1 patch (0.1 mg total) onto the skin 2 (two) times a week., Disp: 25 patch, Rfl: 1   levothyroxine  (SYNTHROID ) 112 MCG tablet, Take 1 tablet by mouth daily., Disp: 90 tablet, Rfl: 0   MAGNESIUM PO, Take 500 mg by mouth daily., Disp: , Rfl:    Multiple Vitamin (MULTIVITAMIN PO), Take by mouth daily., Disp: , Rfl:    Omega-3 Fatty Acids (OMEGA 3 PO), Take 1,000 mg by mouth daily., Disp: , Rfl:    ondansetron  (ZOFRAN ) 4 MG tablet, Take 1 tablet (4 mg total) by mouth every 8 (eight) hours as needed for nausea or vomiting. (Patient not taking: Reported on 02/19/2024), Disp: 20 tablet, Rfl: 0   Tirzepatide-Weight Management (ZEPBOUND [place]), Inject 10 mg into the skin once a week., Disp: , Rfl:    cephALEXin  (KEFLEX ) 500 MG capsule, Take 1 capsule (500 mg total) by mouth 3 (three) times daily., Disp: 30 capsule, Rfl: 2  Social History   Tobacco Use  Smoking Status Never  Smokeless Tobacco Never    Allergies  Allergen Reactions   Grass Extracts [Gramineae Pollens] Itching   Objective:   There were no vitals filed for this visit.  There is no height or weight on file to calculate BMI. Constitutional Well developed. Well nourished.  Vascular Foot warm and well perfused. Capillary refill normal  to all digits.   Neurologic Normal speech. Oriented to person, place, and time. Epicritic sensation to light touch grossly present bilaterally.  Dermatologic Skin healing well without signs of infection. Skin edges well coapted without signs of infection.  Orthopedic: Tenderness to palpation noted about the surgical site.   Radiographs: Interval removal of pin. Toe well aligned.   Assessment:   1. Hammertoe of left foot      Plan:  Patient was evaluated and treated and all questions answered.  S/p foot surgery left -Progressing as expected post-operatively. -WB Status: WBAT in regular shoe.  -Medications: n/a Patient discharged from surgical standpoint.   No follow-ups on file.

## 2024-06-08 ENCOUNTER — Encounter: Payer: Self-pay | Admitting: Family Medicine

## 2024-06-08 DIAGNOSIS — G4725 Circadian rhythm sleep disorder, jet lag type: Secondary | ICD-10-CM

## 2024-06-08 MED ORDER — ZOLPIDEM TARTRATE 5 MG PO TABS: 5.0000 mg | ORAL_TABLET | Freq: Every evening | ORAL | 0 refills | Status: AC | PRN

## 2024-06-22 ENCOUNTER — Encounter: Payer: Self-pay | Admitting: Podiatry

## 2024-06-24 ENCOUNTER — Ambulatory Visit (INDEPENDENT_AMBULATORY_CARE_PROVIDER_SITE_OTHER)

## 2024-06-24 ENCOUNTER — Ambulatory Visit: Admitting: Podiatry

## 2024-06-24 ENCOUNTER — Encounter: Payer: Self-pay | Admitting: Podiatry

## 2024-06-24 DIAGNOSIS — M2042 Other hammer toe(s) (acquired), left foot: Secondary | ICD-10-CM | POA: Diagnosis not present

## 2024-06-24 DIAGNOSIS — M2041 Other hammer toe(s) (acquired), right foot: Secondary | ICD-10-CM | POA: Diagnosis not present

## 2024-06-24 NOTE — Progress Notes (Signed)
 Subjective:  Patient ID: Kristina Chase, female    DOB: 14-May-1961,  MRN: [id number]  Chief Complaint  Patient presents with   Post-op Problem    POV #5 DOS 02/11/24 LT 2ND HAMMERTOE REPAIR It didn't take, my toe is still bent up.       63 y.o. female returns for concern of reoccurrence of left second hammertoe.  It has been 4 months since surgery and the toe is still sticking up and causing her pain with wearing certain shoes.  She is here for reevaluation.  She also relates her right second toe is doing a similar thing and wondering what can be done about this.  Review of Systems: Negative except as noted in the HPI. Denies N/V/F/Ch.  Past Medical History:  Diagnosis Date   Hypothyroid    Rheumatoid arthritis (HCC)     Current Outpatient Medications:    Cetirizine HCl (ZYRTEC PO), Take by mouth., Disp: , Rfl:    Cyanocobalamin (B-12 PO), Take 3,000 mcg by mouth daily., Disp: , Rfl:    estradiol  (VIVELLE -DOT) 0.1 MG/24HR patch, Place 1 patch (0.1 mg total) onto the skin 2 (two) times a week., Disp: 25 patch, Rfl: 1   levothyroxine  (SYNTHROID ) 112 MCG tablet, Take 1 tablet by mouth daily., Disp: 90 tablet, Rfl: 0   MAGNESIUM PO, Take 500 mg by mouth daily., Disp: , Rfl:    Multiple Vitamin (MULTIVITAMIN PO), Take by mouth daily., Disp: , Rfl:    Omega-3 Fatty Acids (OMEGA 3 PO), Take 1,000 mg by mouth daily., Disp: , Rfl:    Tirzepatide-Weight Management (ZEPBOUND [place]), Inject 10 mg into the skin once a week., Disp: , Rfl:    zolpidem  (AMBIEN ) 5 MG tablet, Take 1 tablet (5 mg total) by mouth at bedtime as needed for sleep., Disp: 10 tablet, Rfl: 0  Social History   Tobacco Use  Smoking Status Never  Smokeless Tobacco Never    Allergies  Allergen Reactions   Grass Extracts [Gramineae Pollens] Itching   Objective:   There were no vitals filed for this visit.  There is no height or weight on file to calculate BMI. Constitutional Well developed. Well nourished.  Vascular  Foot warm and well perfused. Capillary refill normal to all digits.   Neurologic Normal speech. Oriented to person, place, and time. Epicritic sensation to light touch grossly present bilaterally.  Dermatologic Skin healing well without signs of infection. Skin edges well coapted without signs of infection.  Orthopedic: Recurrence of right second hammer digit.  Mostly rigid at the PIPJ but there is some flexibility noted dorsiflexion noted of the MPJ as well.  Right foot hammertoe with flexibility noted at the PIPJ.   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  Some fusion noted medially but lateral portion is not fused.  Toe recurrence of hammertoe. Assessment:   1. Hammertoe of left foot      Plan:  Patient was evaluated and treated and all questions answered. -X-rays reviewed with patient. -Educated on hammertoes and treatment options  -Discussed at this point could consider flexor tenotomy's to see if we can get the toe to sit down a little bit better there is some flexibility noted in the second digit.  Also noted flexibility of the right second digit and can go ahead with flexor tenotomy on this side.  Discussed if this does not improve things would have to consider reoperating in attempted fusion of the second PIPJ again. -Flexor tenotomy's performed  for second bilateral digits.  Procedure below. -Discussed need for potential surgery if pain does not improved.  -Patient to follow-up as needed. Discussed calling if any changes or increased pain.   Procedure Percutaneous Flexor Tenotomy ([id number]): Procedure: Right second digit bilateral digit flexor tenotomy  Surgeon: Asberry Failing, DPM Pre-op Dx: Bilateral second digit hammertoe Post-op: Same Place of Surgery: Office surgical suite  Indications for surgery: Flexible hammertoe deformity.  Findings: Digit was able to be straightened after tenotomy preformed.   Today, the risks,  benefits, and complications of this procedure were explained, and written consent was obtained. The patient was then placed in a semi-reclined position, an the foot was prepped and draped in the usual aseptic manner. Attention was directed to the offending toe. Local anesthetic of 3 ml of 1% lidocaine plain was administered. An 18 gauge needed was used in the plantar aspect of the digit and tenotomy was preformed. Straightening of the toe was noted. Hemostasis was obtained by use of compression. Area was copiously irrigated. Digit was splinted with betadine gauze and dry dressing.   Written and oral post-op instructions were given to the patient for the following home care:  1)Remove dressing tomorrow  2) Do not soak the area  3) Cover toe with band-aid if needed 4)Notify office if toe fails to improve or if there is an increased in redness or swelling.     No follow-ups on file.

## 2024-07-09 ENCOUNTER — Other Ambulatory Visit: Payer: Self-pay | Admitting: Family Medicine

## 2024-07-09 DIAGNOSIS — E063 Autoimmune thyroiditis: Secondary | ICD-10-CM

## 2024-07-11 ENCOUNTER — Other Ambulatory Visit: Payer: Self-pay | Admitting: Family Medicine

## 2024-07-11 DIAGNOSIS — Z79890 Hormone replacement therapy (postmenopausal): Secondary | ICD-10-CM

## 2024-07-20 ENCOUNTER — Encounter: Payer: Self-pay | Admitting: Family Medicine

## 2024-07-20 NOTE — Telephone Encounter (Signed)
[facility] to accept patient

## 2024-07-21 ENCOUNTER — Encounter: Payer: Self-pay | Admitting: Podiatry

## 2024-10-19 ENCOUNTER — Encounter: Admitting: Family Medicine
